# Patient Record
Sex: Female | Born: 1940 | Hispanic: Yes
Health system: Midwestern US, Community
[De-identification: ages and names within clinical notes are randomized; demographics above are authoritative.]

## PROBLEM LIST (undated history)

## (undated) DIAGNOSIS — F329 Major depressive disorder, single episode, unspecified: Secondary | ICD-10-CM

## (undated) DIAGNOSIS — E785 Hyperlipidemia, unspecified: Secondary | ICD-10-CM

## (undated) DIAGNOSIS — T7840XA Allergy, unspecified, initial encounter: Secondary | ICD-10-CM

## (undated) DIAGNOSIS — F419 Anxiety disorder, unspecified: Secondary | ICD-10-CM

## (undated) DIAGNOSIS — M8580 Other specified disorders of bone density and structure, unspecified site: Secondary | ICD-10-CM

## (undated) DIAGNOSIS — F32A Depression, unspecified: Secondary | ICD-10-CM

## (undated) DIAGNOSIS — D649 Anemia, unspecified: Secondary | ICD-10-CM

## (undated) DIAGNOSIS — E1136 Type 2 diabetes mellitus with diabetic cataract: Secondary | ICD-10-CM

## (undated) HISTORY — DX: Major depressive disorder, single episode, unspecified: F32.9

## (undated) HISTORY — DX: Hyperlipidemia, unspecified: E78.5

## (undated) HISTORY — DX: Other specified disorders of bone density and structure, unspecified site: M85.80

## (undated) HISTORY — DX: Anxiety disorder, unspecified: F41.9

## (undated) HISTORY — DX: Anemia, unspecified: D64.9

## (undated) HISTORY — DX: Depression, unspecified: F32.A

## (undated) HISTORY — DX: Allergy, unspecified, initial encounter: T78.40XA

---

## 1961-10-05 HISTORY — PX: APPENDECTOMY: SHX54

## 1998-05-01 ENCOUNTER — Encounter: Admission: RE | Admit: 1998-05-01 | Discharge: 1998-05-01 | Payer: Self-pay | Admitting: Internal Medicine

## 1998-09-19 ENCOUNTER — Encounter: Admission: RE | Admit: 1998-09-19 | Discharge: 1998-09-19 | Payer: Self-pay | Admitting: Internal Medicine

## 1999-08-07 ENCOUNTER — Encounter: Admission: RE | Admit: 1999-08-07 | Discharge: 1999-08-07 | Payer: Self-pay | Admitting: Internal Medicine

## 1999-11-25 ENCOUNTER — Encounter: Admission: RE | Admit: 1999-11-25 | Discharge: 1999-11-25 | Payer: Self-pay | Admitting: Internal Medicine

## 2004-08-11 ENCOUNTER — Ambulatory Visit: Payer: Self-pay | Admitting: Family Medicine

## 2004-10-07 ENCOUNTER — Ambulatory Visit: Payer: Self-pay | Admitting: Family Medicine

## 2004-10-30 ENCOUNTER — Ambulatory Visit: Payer: Self-pay | Admitting: Family Medicine

## 2005-02-11 ENCOUNTER — Ambulatory Visit: Payer: Self-pay | Admitting: Family Medicine

## 2005-07-09 ENCOUNTER — Ambulatory Visit: Payer: Self-pay | Admitting: Family Medicine

## 2005-07-16 ENCOUNTER — Ambulatory Visit: Payer: Self-pay | Admitting: Family Medicine

## 2005-07-23 ENCOUNTER — Ambulatory Visit: Payer: Self-pay | Admitting: Family Medicine

## 2005-08-10 ENCOUNTER — Ambulatory Visit: Payer: Self-pay | Admitting: Family Medicine

## 2005-12-03 ENCOUNTER — Encounter (INDEPENDENT_AMBULATORY_CARE_PROVIDER_SITE_OTHER): Payer: Self-pay | Admitting: Internal Medicine

## 2005-12-07 ENCOUNTER — Ambulatory Visit: Payer: Self-pay | Admitting: Family Medicine

## 2005-12-17 ENCOUNTER — Ambulatory Visit: Payer: Self-pay | Admitting: Family Medicine

## 2005-12-29 ENCOUNTER — Other Ambulatory Visit: Admission: RE | Admit: 2005-12-29 | Discharge: 2005-12-29 | Payer: Self-pay | Admitting: Family Medicine

## 2005-12-29 ENCOUNTER — Ambulatory Visit: Payer: Self-pay | Admitting: Family Medicine

## 2006-06-03 ENCOUNTER — Ambulatory Visit: Payer: Self-pay | Admitting: Family Medicine

## 2006-07-15 ENCOUNTER — Ambulatory Visit: Payer: Self-pay | Admitting: Family Medicine

## 2006-08-24 ENCOUNTER — Ambulatory Visit: Payer: Self-pay | Admitting: Family Medicine

## 2006-08-30 ENCOUNTER — Ambulatory Visit: Payer: Self-pay | Admitting: Family Medicine

## 2006-09-01 ENCOUNTER — Ambulatory Visit: Payer: Self-pay | Admitting: Family Medicine

## 2006-11-25 ENCOUNTER — Ambulatory Visit: Payer: Self-pay | Admitting: Family Medicine

## 2007-01-03 ENCOUNTER — Ambulatory Visit: Payer: Self-pay | Admitting: Family Medicine

## 2007-04-19 ENCOUNTER — Encounter: Payer: Self-pay | Admitting: Internal Medicine

## 2007-04-19 DIAGNOSIS — N841 Polyp of cervix uteri: Secondary | ICD-10-CM | POA: Insufficient documentation

## 2007-04-19 DIAGNOSIS — M949 Disorder of cartilage, unspecified: Secondary | ICD-10-CM

## 2007-04-19 DIAGNOSIS — G5600 Carpal tunnel syndrome, unspecified upper limb: Secondary | ICD-10-CM

## 2007-04-19 DIAGNOSIS — E785 Hyperlipidemia, unspecified: Secondary | ICD-10-CM

## 2007-04-19 DIAGNOSIS — M899 Disorder of bone, unspecified: Secondary | ICD-10-CM | POA: Insufficient documentation

## 2007-04-19 DIAGNOSIS — K5289 Other specified noninfective gastroenteritis and colitis: Secondary | ICD-10-CM

## 2007-04-21 ENCOUNTER — Ambulatory Visit: Payer: Self-pay | Admitting: Family Medicine

## 2007-04-21 DIAGNOSIS — R5381 Other malaise: Secondary | ICD-10-CM | POA: Insufficient documentation

## 2007-04-21 DIAGNOSIS — N959 Unspecified menopausal and perimenopausal disorder: Secondary | ICD-10-CM | POA: Insufficient documentation

## 2007-04-21 DIAGNOSIS — K219 Gastro-esophageal reflux disease without esophagitis: Secondary | ICD-10-CM

## 2007-04-21 DIAGNOSIS — R5383 Other fatigue: Secondary | ICD-10-CM

## 2007-04-22 LAB — CONVERTED CEMR LAB
ALT: 22 units/L (ref 0–35)
BUN: 8 mg/dL (ref 6–23)
Basophils Absolute: 0 10*3/uL (ref 0.0–0.1)
Basophils Relative: 0.5 % (ref 0.0–1.0)
Calcium: 9.4 mg/dL (ref 8.4–10.5)
Cholesterol: 160 mg/dL (ref 0–200)
Eosinophils Absolute: 0.1 10*3/uL (ref 0.0–0.6)
Eosinophils Relative: 2.5 % (ref 0.0–5.0)
GFR calc Af Amer: 129 mL/min
GFR calc non Af Amer: 107 mL/min
HCT: 38.3 % (ref 36.0–46.0)
HDL: 53.9 mg/dL (ref >39.0)
Lymphocytes Relative: 40.1 % (ref 12.0–46.0)
MCHC: 35 g/dL (ref 30.0–36.0)
MCV: 89.4 fL (ref 78.0–100.0)
Monocytes Relative: 8.3 % (ref 3.0–11.0)
Neutrophils Relative %: 48.6 % (ref 43.0–77.0)
Platelets: 248 10*3/uL (ref 150–400)
RBC: 4.28 M/uL (ref 3.87–5.11)
RDW: 12.6 % (ref 11.5–14.6)
Total CHOL/HDL Ratio: 3
Triglycerides: 108 mg/dL (ref 0–149)
WBC: 4.7 10*3/uL (ref 4.5–10.5)

## 2007-05-09 ENCOUNTER — Ambulatory Visit: Payer: Self-pay | Admitting: Family Medicine

## 2007-07-21 ENCOUNTER — Encounter: Payer: Self-pay | Admitting: Obstetrics & Gynecology

## 2007-07-21 ENCOUNTER — Encounter (INDEPENDENT_AMBULATORY_CARE_PROVIDER_SITE_OTHER): Payer: Self-pay | Admitting: Internal Medicine

## 2007-07-21 ENCOUNTER — Ambulatory Visit: Payer: Self-pay | Admitting: Obstetrics & Gynecology

## 2007-07-22 ENCOUNTER — Ambulatory Visit: Payer: Self-pay | Admitting: Family Medicine

## 2007-07-27 ENCOUNTER — Encounter: Admission: RE | Admit: 2007-07-27 | Discharge: 2007-07-27 | Payer: Self-pay | Admitting: Obstetrics & Gynecology

## 2007-07-29 ENCOUNTER — Ambulatory Visit: Payer: Self-pay | Admitting: Family Medicine

## 2007-07-29 ENCOUNTER — Encounter (INDEPENDENT_AMBULATORY_CARE_PROVIDER_SITE_OTHER): Payer: Self-pay | Admitting: Internal Medicine

## 2007-07-29 DIAGNOSIS — N942 Vaginismus: Secondary | ICD-10-CM

## 2007-07-29 DIAGNOSIS — J309 Allergic rhinitis, unspecified: Secondary | ICD-10-CM | POA: Insufficient documentation

## 2007-07-29 LAB — CONVERTED CEMR LAB
Specific Gravity, Urine: 1.01
pH: 7.5

## 2007-08-24 ENCOUNTER — Ambulatory Visit: Payer: Self-pay | Admitting: Family Medicine

## 2007-08-24 DIAGNOSIS — R609 Edema, unspecified: Secondary | ICD-10-CM

## 2007-08-25 ENCOUNTER — Telehealth (INDEPENDENT_AMBULATORY_CARE_PROVIDER_SITE_OTHER): Payer: Self-pay | Admitting: *Deleted

## 2007-08-26 ENCOUNTER — Ambulatory Visit: Payer: Self-pay | Admitting: Family Medicine

## 2007-08-29 ENCOUNTER — Encounter (INDEPENDENT_AMBULATORY_CARE_PROVIDER_SITE_OTHER): Payer: Self-pay | Admitting: Internal Medicine

## 2007-09-15 ENCOUNTER — Ambulatory Visit: Payer: Self-pay | Admitting: Family Medicine

## 2007-09-19 ENCOUNTER — Telehealth (INDEPENDENT_AMBULATORY_CARE_PROVIDER_SITE_OTHER): Payer: Self-pay | Admitting: Internal Medicine

## 2007-09-20 ENCOUNTER — Ambulatory Visit: Payer: Self-pay | Admitting: Family Medicine

## 2007-09-20 ENCOUNTER — Encounter (INDEPENDENT_AMBULATORY_CARE_PROVIDER_SITE_OTHER): Payer: Self-pay | Admitting: Internal Medicine

## 2007-09-20 DIAGNOSIS — Z888 Allergy status to other drugs, medicaments and biological substances status: Secondary | ICD-10-CM

## 2007-09-23 ENCOUNTER — Encounter (INDEPENDENT_AMBULATORY_CARE_PROVIDER_SITE_OTHER): Payer: Self-pay | Admitting: Internal Medicine

## 2007-10-20 ENCOUNTER — Encounter (INDEPENDENT_AMBULATORY_CARE_PROVIDER_SITE_OTHER): Payer: Self-pay | Admitting: *Deleted

## 2007-10-26 ENCOUNTER — Ambulatory Visit: Payer: Self-pay | Admitting: Family Medicine

## 2007-10-27 LAB — CONVERTED CEMR LAB
ALT: 21 units/L (ref 0–35)
AST: 22 units/L (ref 0–37)
HDL: 54.5 mg/dL (ref >39.0)
Triglycerides: 111 mg/dL (ref 0–149)
VLDL: 22 mg/dL (ref 0–40)

## 2007-12-02 ENCOUNTER — Telehealth (INDEPENDENT_AMBULATORY_CARE_PROVIDER_SITE_OTHER): Payer: Self-pay | Admitting: Internal Medicine

## 2007-12-06 ENCOUNTER — Telehealth (INDEPENDENT_AMBULATORY_CARE_PROVIDER_SITE_OTHER): Payer: Self-pay | Admitting: Internal Medicine

## 2008-02-02 ENCOUNTER — Ambulatory Visit: Payer: Self-pay | Admitting: Family Medicine

## 2008-05-02 ENCOUNTER — Encounter (INDEPENDENT_AMBULATORY_CARE_PROVIDER_SITE_OTHER): Payer: Self-pay | Admitting: *Deleted

## 2008-05-14 ENCOUNTER — Telehealth (INDEPENDENT_AMBULATORY_CARE_PROVIDER_SITE_OTHER): Payer: Self-pay | Admitting: Internal Medicine

## 2008-06-20 ENCOUNTER — Ambulatory Visit: Payer: Self-pay | Admitting: Family Medicine

## 2008-06-20 LAB — CONVERTED CEMR LAB
Albumin: 4.1 g/dL (ref 3.5–5.2)
Bilirubin, Direct: 0.1 mg/dL (ref 0.0–0.3)
Cholesterol: 189 mg/dL (ref 0–200)
HDL: 54.2 mg/dL (ref >39.0)
Total Bilirubin: 0.8 mg/dL (ref 0.3–1.2)
Total CHOL/HDL Ratio: 3.5
Triglycerides: 240 mg/dL (ref 0–149)

## 2008-06-27 ENCOUNTER — Ambulatory Visit: Payer: Self-pay | Admitting: Family Medicine

## 2008-06-27 LAB — CONVERTED CEMR LAB
Bilirubin Urine: NEGATIVE
Glucose, Urine, Semiquant: NEGATIVE
RBC / HPF: 0
Urobilinogen, UA: NEGATIVE
pH: 7

## 2008-07-13 ENCOUNTER — Encounter (INDEPENDENT_AMBULATORY_CARE_PROVIDER_SITE_OTHER): Payer: Self-pay | Admitting: Internal Medicine

## 2008-07-13 ENCOUNTER — Ambulatory Visit: Payer: Self-pay | Admitting: Internal Medicine

## 2008-07-18 ENCOUNTER — Encounter (INDEPENDENT_AMBULATORY_CARE_PROVIDER_SITE_OTHER): Payer: Self-pay | Admitting: Internal Medicine

## 2008-08-01 ENCOUNTER — Ambulatory Visit: Payer: Self-pay | Admitting: Family Medicine

## 2008-08-03 ENCOUNTER — Telehealth (INDEPENDENT_AMBULATORY_CARE_PROVIDER_SITE_OTHER): Payer: Self-pay | Admitting: Internal Medicine

## 2008-09-03 ENCOUNTER — Telehealth: Payer: Self-pay | Admitting: Family Medicine

## 2008-10-29 ENCOUNTER — Telehealth: Payer: Self-pay | Admitting: Family Medicine

## 2008-11-19 ENCOUNTER — Telehealth: Payer: Self-pay | Admitting: Family Medicine

## 2008-12-19 ENCOUNTER — Ambulatory Visit: Payer: Self-pay | Admitting: Family Medicine

## 2008-12-19 ENCOUNTER — Telehealth (INDEPENDENT_AMBULATORY_CARE_PROVIDER_SITE_OTHER): Payer: Self-pay | Admitting: Internal Medicine

## 2008-12-19 DIAGNOSIS — R229 Localized swelling, mass and lump, unspecified: Secondary | ICD-10-CM

## 2008-12-20 ENCOUNTER — Telehealth (INDEPENDENT_AMBULATORY_CARE_PROVIDER_SITE_OTHER): Payer: Self-pay | Admitting: Internal Medicine

## 2008-12-27 ENCOUNTER — Telehealth (INDEPENDENT_AMBULATORY_CARE_PROVIDER_SITE_OTHER): Payer: Self-pay | Admitting: Internal Medicine

## 2009-01-02 ENCOUNTER — Ambulatory Visit: Payer: Self-pay | Admitting: Family Medicine

## 2009-01-08 LAB — CONVERTED CEMR LAB
Cholesterol: 201 mg/dL — ABNORMAL HIGH (ref 0–200)
Direct LDL: 95.3 mg/dL
Triglycerides: 228 mg/dL — ABNORMAL HIGH (ref 0.0–149.0)
VLDL: 45.6 mg/dL — ABNORMAL HIGH (ref 0.0–40.0)

## 2009-01-09 ENCOUNTER — Encounter (INDEPENDENT_AMBULATORY_CARE_PROVIDER_SITE_OTHER): Payer: Self-pay | Admitting: Internal Medicine

## 2009-03-15 ENCOUNTER — Encounter (INDEPENDENT_AMBULATORY_CARE_PROVIDER_SITE_OTHER): Payer: Self-pay | Admitting: Internal Medicine

## 2009-03-27 ENCOUNTER — Encounter (INDEPENDENT_AMBULATORY_CARE_PROVIDER_SITE_OTHER): Payer: Self-pay | Admitting: Internal Medicine

## 2009-04-30 ENCOUNTER — Ambulatory Visit: Payer: Self-pay | Admitting: Family Medicine

## 2009-04-30 DIAGNOSIS — M62838 Other muscle spasm: Secondary | ICD-10-CM | POA: Insufficient documentation

## 2009-05-03 ENCOUNTER — Telehealth: Payer: Self-pay | Admitting: Family Medicine

## 2009-05-06 ENCOUNTER — Telehealth: Payer: Self-pay | Admitting: Family Medicine

## 2009-05-08 ENCOUNTER — Encounter: Payer: Self-pay | Admitting: Family Medicine

## 2009-05-09 ENCOUNTER — Ambulatory Visit: Payer: Self-pay | Admitting: Family Medicine

## 2009-05-17 ENCOUNTER — Encounter: Payer: Self-pay | Admitting: Family Medicine

## 2009-06-05 ENCOUNTER — Inpatient Hospital Stay: Payer: Self-pay | Admitting: Internal Medicine

## 2009-06-05 ENCOUNTER — Ambulatory Visit: Payer: Self-pay | Admitting: Internal Medicine

## 2009-06-05 DIAGNOSIS — R079 Chest pain, unspecified: Secondary | ICD-10-CM | POA: Insufficient documentation

## 2009-06-07 ENCOUNTER — Ambulatory Visit: Payer: Self-pay | Admitting: Family Medicine

## 2009-06-11 LAB — CONVERTED CEMR LAB
CO2: 33 meq/L — ABNORMAL HIGH (ref 19–32)
Calcium: 9.5 mg/dL (ref 8.4–10.5)
Chloride: 98 meq/L (ref 96–112)
Creatinine, Ser: 0.6 mg/dL (ref 0.4–1.2)
Eosinophils Absolute: 0.1 10*3/uL (ref 0.0–0.7)
Glucose, Bld: 94 mg/dL (ref 70–99)
Lymphs Abs: 1.8 10*3/uL (ref 0.7–4.0)
MCHC: 33.8 g/dL (ref 30.0–36.0)
MCV: 94.7 fL (ref 78.0–100.0)
Neutrophils Relative %: 56.1 % (ref 43.0–77.0)
Potassium: 4.3 meq/L (ref 3.5–5.1)
RBC: 4.32 M/uL (ref 3.87–5.11)
RDW: 13.2 % (ref 11.5–14.6)
Sodium: 137 meq/L (ref 135–145)
TSH: 1.62 microintl units/mL (ref 0.35–5.50)

## 2009-06-12 ENCOUNTER — Ambulatory Visit: Payer: Self-pay | Admitting: Family Medicine

## 2009-06-13 ENCOUNTER — Telehealth (INDEPENDENT_AMBULATORY_CARE_PROVIDER_SITE_OTHER): Payer: Self-pay | Admitting: Internal Medicine

## 2009-06-14 ENCOUNTER — Encounter (INDEPENDENT_AMBULATORY_CARE_PROVIDER_SITE_OTHER): Payer: Self-pay | Admitting: Internal Medicine

## 2009-06-14 ENCOUNTER — Telehealth (INDEPENDENT_AMBULATORY_CARE_PROVIDER_SITE_OTHER): Payer: Self-pay | Admitting: Internal Medicine

## 2009-06-17 ENCOUNTER — Encounter: Payer: Self-pay | Admitting: Family Medicine

## 2009-07-10 ENCOUNTER — Telehealth (INDEPENDENT_AMBULATORY_CARE_PROVIDER_SITE_OTHER): Payer: Self-pay | Admitting: Internal Medicine

## 2009-07-10 ENCOUNTER — Ambulatory Visit: Payer: Self-pay | Admitting: Family Medicine

## 2009-07-11 LAB — CONVERTED CEMR LAB
AST: 22 units/L (ref 0–37)
Albumin: 3.9 g/dL (ref 3.5–5.2)
LDL Cholesterol: 71 mg/dL (ref 0–99)
Total Protein: 6.8 g/dL (ref 6.0–8.3)
Triglycerides: 82 mg/dL (ref 0.0–149.0)
VLDL: 16.4 mg/dL (ref 0.0–40.0)

## 2009-08-20 ENCOUNTER — Ambulatory Visit: Payer: Self-pay | Admitting: Family Medicine

## 2009-09-06 ENCOUNTER — Telehealth (INDEPENDENT_AMBULATORY_CARE_PROVIDER_SITE_OTHER): Payer: Self-pay | Admitting: Internal Medicine

## 2009-10-31 ENCOUNTER — Telehealth: Payer: Self-pay | Admitting: Family Medicine

## 2009-11-18 ENCOUNTER — Ambulatory Visit: Payer: Self-pay | Admitting: Family Medicine

## 2009-11-28 ENCOUNTER — Telehealth: Payer: Self-pay | Admitting: Family Medicine

## 2009-12-24 ENCOUNTER — Telehealth: Payer: Self-pay | Admitting: Family Medicine

## 2009-12-30 ENCOUNTER — Telehealth: Payer: Self-pay | Admitting: Family Medicine

## 2010-01-03 ENCOUNTER — Telehealth: Payer: Self-pay | Admitting: Family Medicine

## 2010-01-06 ENCOUNTER — Ambulatory Visit: Payer: Self-pay | Admitting: Family Medicine

## 2010-01-09 LAB — CONVERTED CEMR LAB
ALT: 24 units/L (ref 0–35)
AST: 22 units/L (ref 0–37)
Cholesterol: 176 mg/dL (ref 0–200)
LDL Cholesterol: 75 mg/dL (ref 0–99)
Triglycerides: 133 mg/dL (ref 0.0–149.0)
VLDL: 26.6 mg/dL (ref 0.0–40.0)

## 2010-01-10 ENCOUNTER — Telehealth: Payer: Self-pay | Admitting: Family Medicine

## 2010-01-13 ENCOUNTER — Ambulatory Visit: Payer: Self-pay | Admitting: Family Medicine

## 2010-01-13 DIAGNOSIS — H6120 Impacted cerumen, unspecified ear: Secondary | ICD-10-CM

## 2010-01-20 ENCOUNTER — Ambulatory Visit: Payer: Self-pay | Admitting: Family Medicine

## 2010-02-03 ENCOUNTER — Ambulatory Visit: Payer: Self-pay | Admitting: Family Medicine

## 2010-02-17 ENCOUNTER — Telehealth: Payer: Self-pay | Admitting: Family Medicine

## 2010-02-24 ENCOUNTER — Encounter: Payer: Self-pay | Admitting: Family Medicine

## 2010-02-24 ENCOUNTER — Encounter: Admission: RE | Admit: 2010-02-24 | Discharge: 2010-02-24 | Payer: Self-pay | Admitting: Family Medicine

## 2010-02-24 ENCOUNTER — Ambulatory Visit: Payer: Self-pay | Admitting: Internal Medicine

## 2010-03-14 ENCOUNTER — Encounter: Payer: Self-pay | Admitting: Family Medicine

## 2010-03-19 ENCOUNTER — Telehealth: Payer: Self-pay | Admitting: Family Medicine

## 2010-04-15 ENCOUNTER — Telehealth: Payer: Self-pay | Admitting: Family Medicine

## 2010-05-14 ENCOUNTER — Telehealth: Payer: Self-pay | Admitting: Family Medicine

## 2010-06-10 ENCOUNTER — Telehealth: Payer: Self-pay | Admitting: Family Medicine

## 2010-06-24 ENCOUNTER — Ambulatory Visit: Payer: Self-pay | Admitting: Family Medicine

## 2010-07-08 ENCOUNTER — Telehealth: Payer: Self-pay | Admitting: Family Medicine

## 2010-07-11 ENCOUNTER — Telehealth: Payer: Self-pay | Admitting: Family Medicine

## 2010-08-01 ENCOUNTER — Telehealth: Payer: Self-pay | Admitting: Family Medicine

## 2010-09-01 ENCOUNTER — Telehealth: Payer: Self-pay | Admitting: Family Medicine

## 2010-09-22 ENCOUNTER — Ambulatory Visit: Payer: Self-pay | Admitting: Family Medicine

## 2010-09-22 ENCOUNTER — Ambulatory Visit: Payer: Self-pay | Admitting: Internal Medicine

## 2010-09-23 ENCOUNTER — Telehealth: Payer: Self-pay | Admitting: Family Medicine

## 2010-09-23 LAB — CONVERTED CEMR LAB
Bilirubin, Direct: 0.1 mg/dL (ref 0.0–0.3)
Cholesterol: 235 mg/dL — ABNORMAL HIGH (ref 0–200)
Direct LDL: 137.2 mg/dL
Total Bilirubin: 0.9 mg/dL (ref 0.3–1.2)
Triglycerides: 135 mg/dL (ref 0.0–149.0)
VLDL: 27 mg/dL (ref 0.0–40.0)

## 2010-09-25 ENCOUNTER — Telehealth: Payer: Self-pay | Admitting: Family Medicine

## 2010-09-25 ENCOUNTER — Encounter: Payer: Self-pay | Admitting: Family Medicine

## 2010-09-26 ENCOUNTER — Telehealth: Payer: Self-pay | Admitting: Family Medicine

## 2010-09-30 ENCOUNTER — Telehealth: Payer: Self-pay | Admitting: Family Medicine

## 2010-10-01 ENCOUNTER — Telehealth: Payer: Self-pay | Admitting: Family Medicine

## 2010-10-15 ENCOUNTER — Ambulatory Visit
Admission: RE | Admit: 2010-10-15 | Discharge: 2010-10-15 | Payer: Self-pay | Source: Home / Self Care | Attending: Family Medicine | Admitting: Family Medicine

## 2010-10-15 DIAGNOSIS — M7120 Synovial cyst of popliteal space [Baker], unspecified knee: Secondary | ICD-10-CM | POA: Insufficient documentation

## 2010-10-15 DIAGNOSIS — M2390 Unspecified internal derangement of unspecified knee: Secondary | ICD-10-CM | POA: Insufficient documentation

## 2010-10-20 IMAGING — CR DG CHEST 1V PORT
1 series · 1 of 1 positions shown · non-contrast
Comparison: none

REASON FOR EXAM: midsternal chest pressure/burning
COMMENTS:

PROCEDURE:     DXR - DXR PORTABLE CHEST SINGLE VIEW  - June 05, 2009 [DATE]
RESULT:     Comparison: None

[view not recorded]
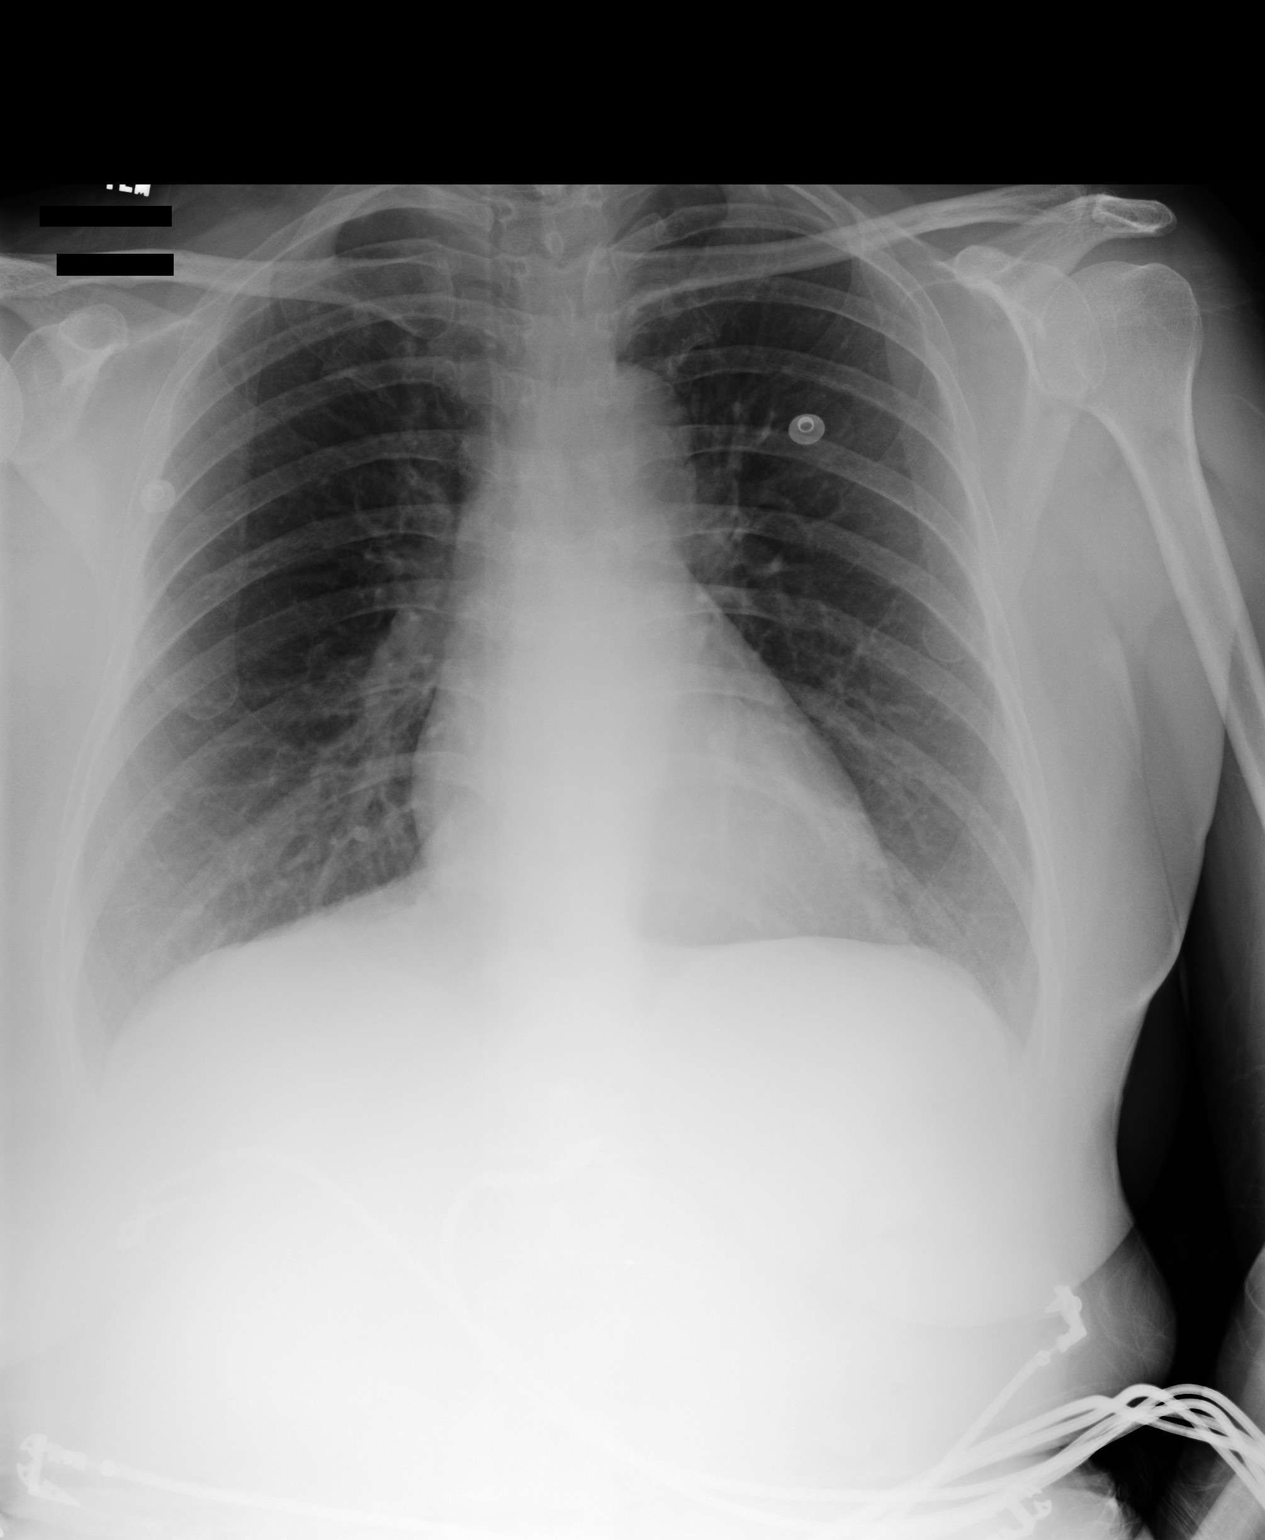

[1 of 1 positions shown; findings below may reference images not displayed]

FINDINGS: Single portable AP chest radiograph is provided. There is no focal
parenchymal opacity, pleural effusion, or pneumothorax. Normal
cardiomediastinal silhouette. The osseous structures are unremarkable.
IMPRESSION: No acute disease of the chest.

## 2010-10-20 IMAGING — CT CT HEAD WITHOUT CONTRAST
1 series · 16 of 29 positions shown, 20 images · non-contrast
Comparison: none

REASON FOR EXAM: presyncope
COMMENTS:

PROCEDURE:     CT  - CT HEAD WITHOUT CONTRAST  - June 05, 2009  [DATE]
RESULT:     Comparison:  None
TECHNIQUE: Multiple axial images from the foramen magnum to the vertex were
obtained without IV contrast.

[Series 2: soft tissue · axial · 0.39mm/px · z∈[+572,+702]mm · 16 of 29 slices shown, 20 images]
[im 2/29  brain]
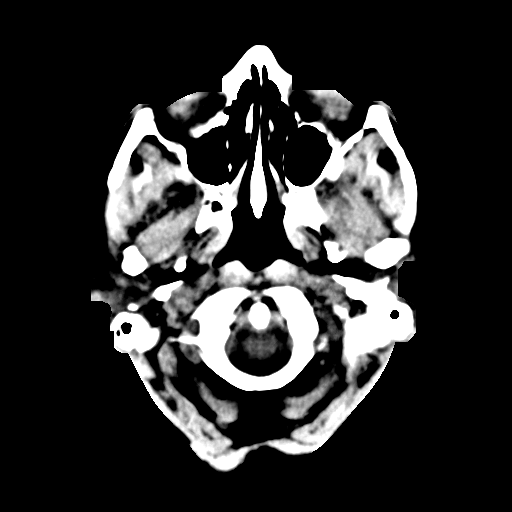
[im 2/29  bone]
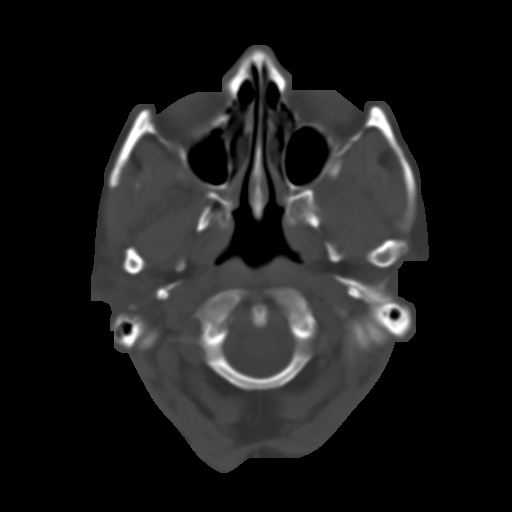
[im 4/29  brain]
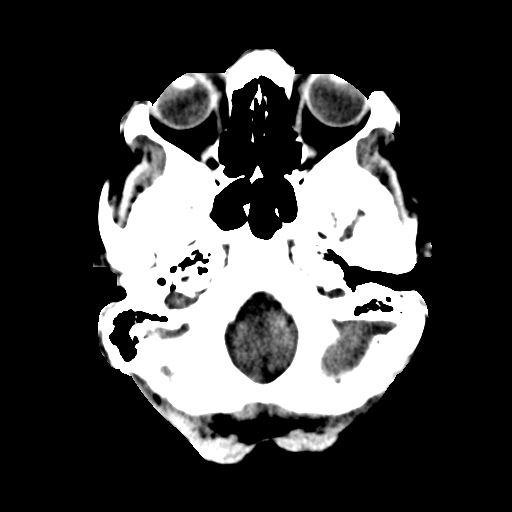
[im 6/29  brain]
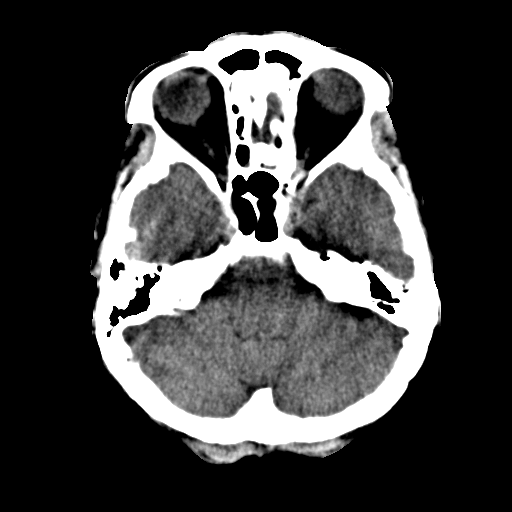
[im 7/29  brain]
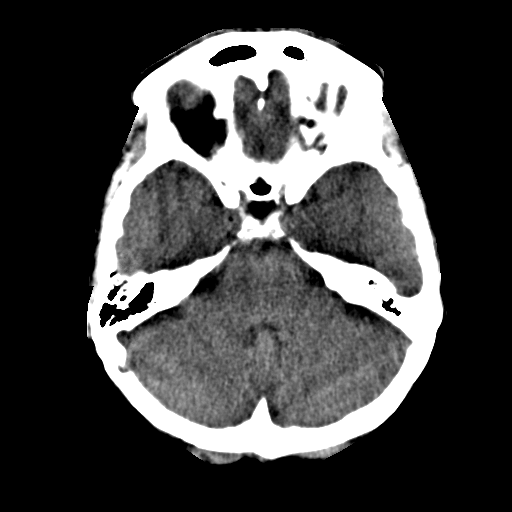
[im 9/29  brain]
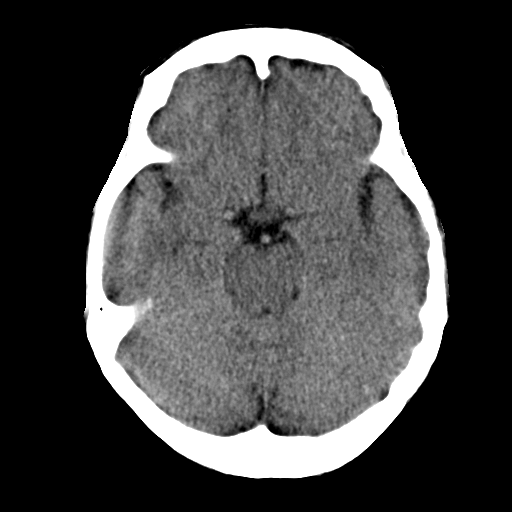
[im 9/29  bone]
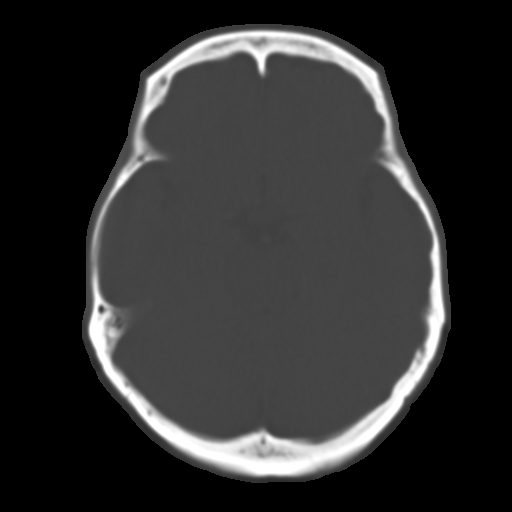
[im 11/29  brain]
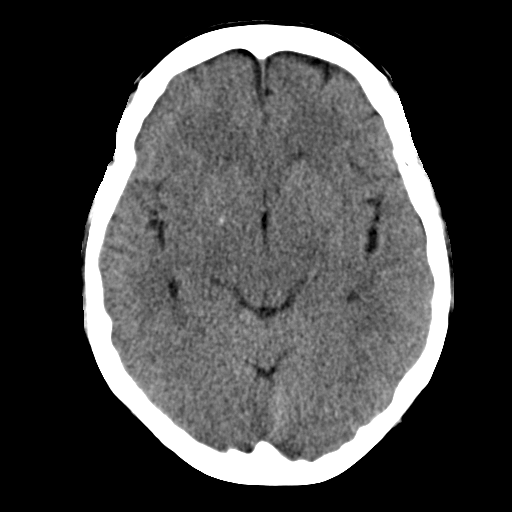
[im 12/29  brain]
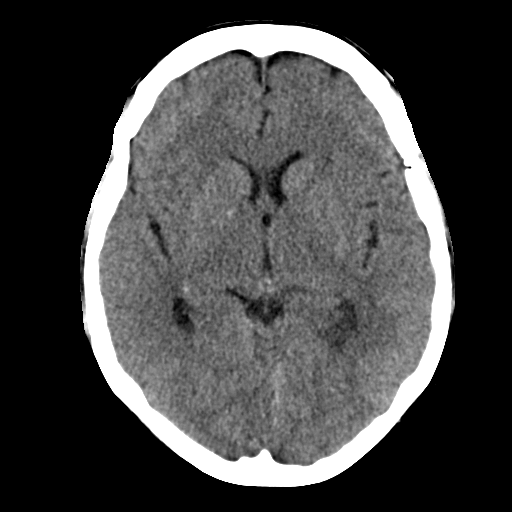
[im 14/29  brain]
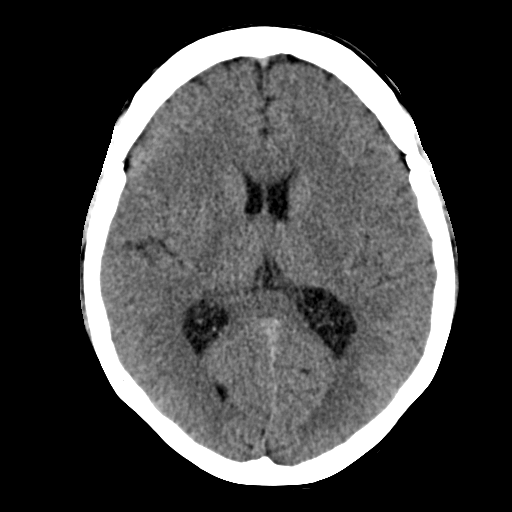
[im 16/29  brain]
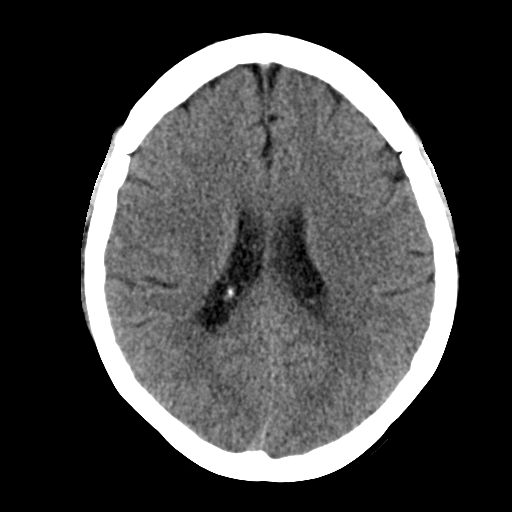
[im 16/29  bone]
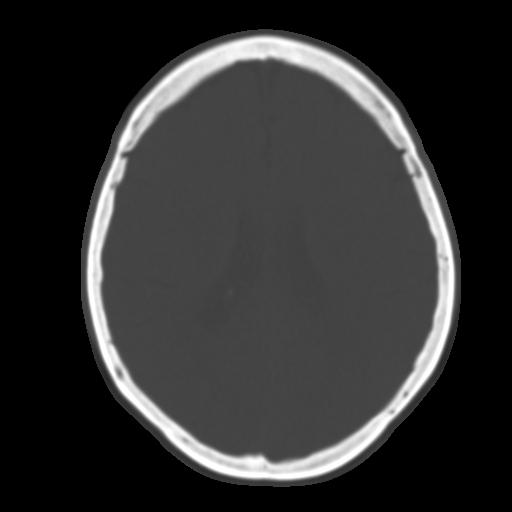
[im 18/29  brain]
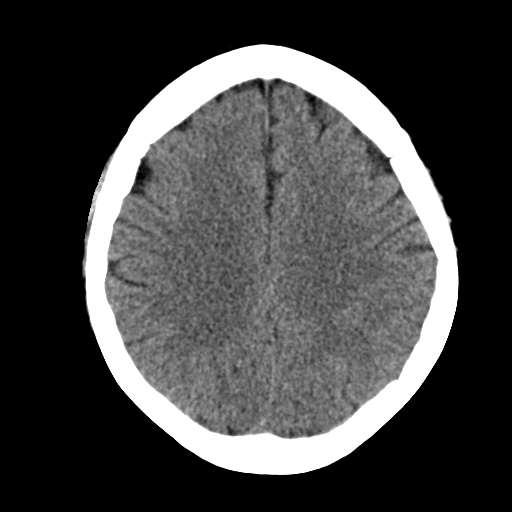
[im 19/29  brain]
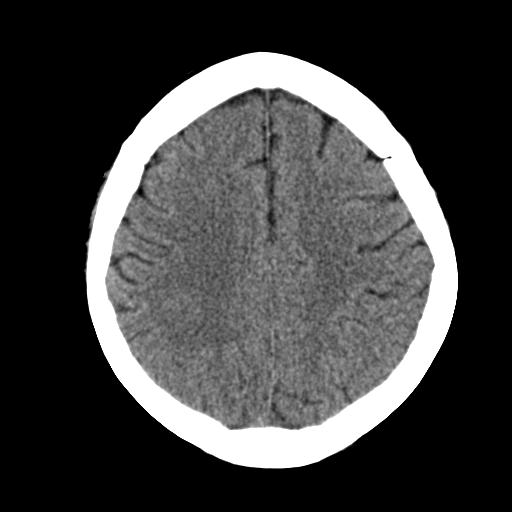
[im 21/29  brain]
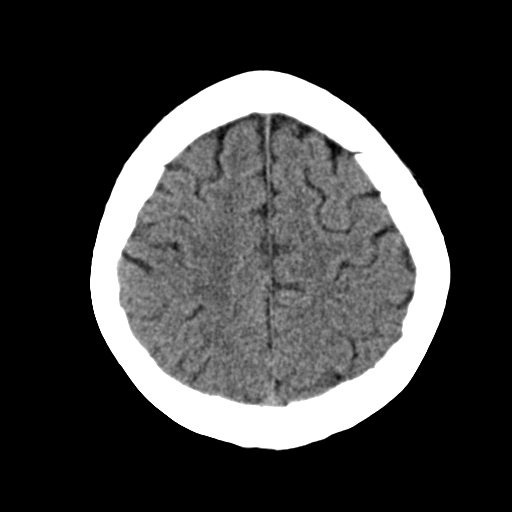
[im 23/29  brain]
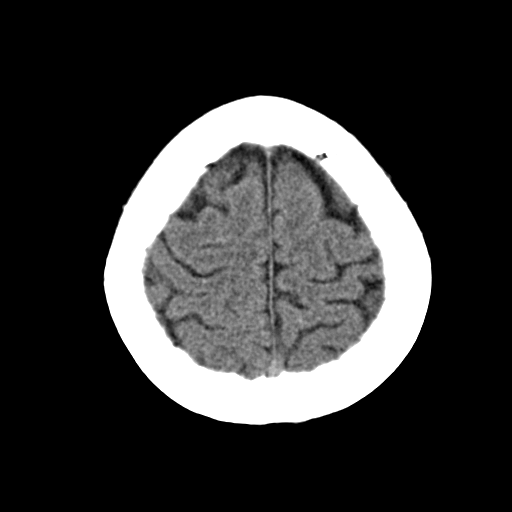
[im 23/29  bone]
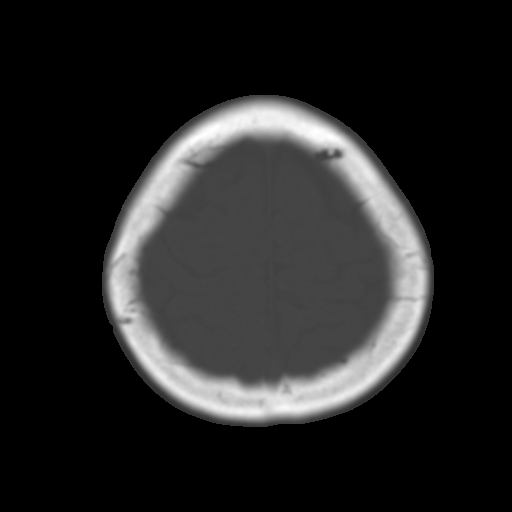
[im 24/29  brain]
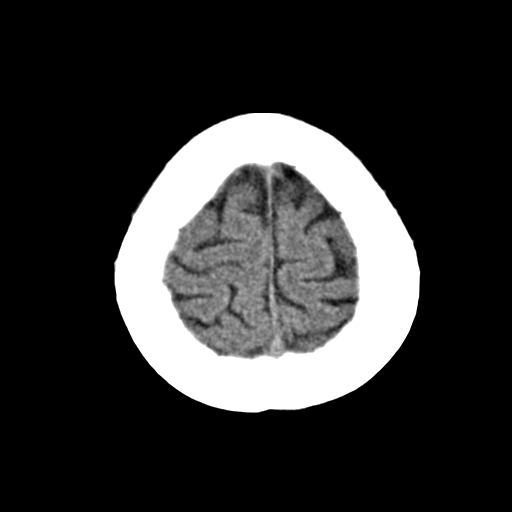
[im 26/29  brain]
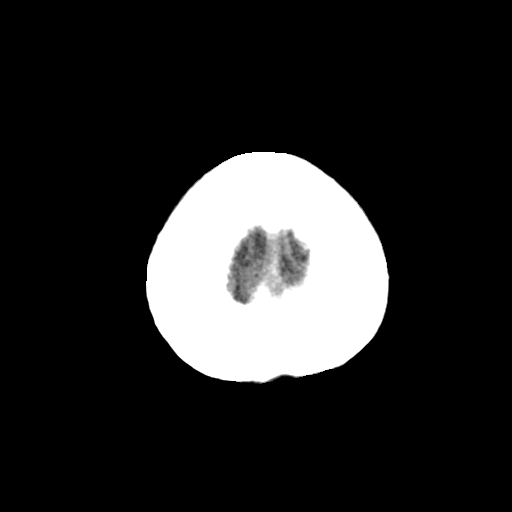
[im 28/29  brain]
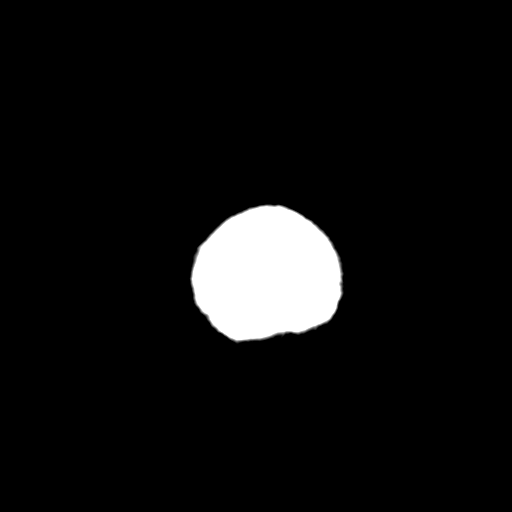

[16 of 29 positions shown; findings below may reference images not displayed]

FINDINGS: There is no evidence of mass effect, midline shift, or extra-axial fluid
collections.  There is no evidence of a space-occupying lesion or
intracranial hemorrhage. There is no evidence of a cortical-based area of
acute infarction.

The ventricles and sulci are appropriate for the patient's age. The basal
cisterns are patent.

Visualized portions of the orbits are unremarkable. The visualized portions
of the paranasal sinuses and mastoid air cells are unremarkable.

The osseous structures are unremarkable.
IMPRESSION: No acute intracranial process.

## 2010-10-28 ENCOUNTER — Telehealth: Payer: Self-pay | Admitting: Family Medicine

## 2010-11-04 NOTE — Assessment & Plan Note (Signed)
Summary: 30 MIN L SIDE PAIN/BILLIE'S PT/CLE   Vital Signs:  Patient profile:   70 year old female Height:      57 inches Weight:      110.50 pounds BMI:     24.00 Temp:     98.6 degrees F oral Pulse rate:   84 / minute Pulse rhythm:   regular BP sitting:   128 / 74  (left arm) Cuff size:   large  Vitals Entered By: Delilah Shan CMA Duncan Dull) (November 18, 2009 2:08 PM) CC: 30 minute - Left side pain   History of Present Illness: 70 yo with 1 week of left sided lower back pain. Works as Conservation officer, nature for [employer].  Was working, turned to left and bent down to get something, since then has this intermittent left sided pain.  8/10 at its worst. Alprazolam which she takes for Anxiety, sometimes helps. No tingling into left lower leg, no LE weakness, no saddle anethesia or urinary complaints. Has had something like this before which happened at work as well. No n/v/d.  Current Medications (verified): 1)  Zoloft 50 Mg Tabs (Sertraline Hcl) .Marland Kitchen.. 1 By Mouth Daily 2)  Lipitor 20 Mg Tabs (Atorvastatin Calcium) .Marland Kitchen.. 1 By Mouth Once Daily 3)  Multivitamins   Tabs (Multiple Vitamin) .... Once Daily 4)  Prilosec 10 Mg  Cpdr (Omeprazole) .... Two Daily 5)  Calcium .... Take One By Mouth Two Times A Day 6)  Ambien 10 Mg Tabs (Zolpidem Tartrate) .... One Tab By Mouth At Bedtime As Needed For Difficulty Sleeping...to Be Used Sparingly. 7)  Alprazolam 0.25 Mg Tabs (Alprazolam) .... 1/2 To 1 Once Daily For Anxiety As Needed 8)  Aspirin 81 Mg  Tabs (Aspirin) .... One Daily 9)  Cyclobenzaprine Hcl 10 Mg  Tabs (Cyclobenzaprine Hcl) .Marland Kitchen.. 1 By Mouth 2 Times Daily As Needed For Back Pain  Allergies: 1)  ! Latex Gloves (Disposable Gloves) 2)  ! Augmentin  Review of Systems      See HPI General:  Denies chills and fever. GU:  Denies dysuria, hematuria, incontinence, urinary frequency, and urinary hesitancy. MS:  Complains of low back pain; denies loss of strength.  Physical Exam  General:  alert,  well-developed, well-nourished, and well-hydrated.   Abdomen:  soft, non-tender, normal bowel sounds, no distention, no masses, no guarding, no abdominal hernia, and no inguinal hernia.     Detailed Back/Spine Exam  Lumbosacral Exam:  Inspection-deformity:    Normal    FROM without pain. tight paraspinous muscles. Palpation-spinal tenderness:  Normal Lying Straight Leg Raise:    Right:  negative    Left:  negative Fabere Test:    Right:  negative    Left:  negative   Impression & Recommendations:  Problem # 1:  LUMBOSACRAL STRAIN, ACUTE (ICD-846.0) Assessment New Likely lumbar strain, no red flag symptoms. Try muscle relaxants as needed, especially at night. See pt instructions for details.  Complete Medication List: 1)  Zoloft 50 Mg Tabs (Sertraline hcl) .Marland Kitchen.. 1 by mouth daily 2)  Lipitor 20 Mg Tabs (Atorvastatin calcium) .Marland Kitchen.. 1 by mouth once daily 3)  Multivitamins Tabs (Multiple vitamin) .... Once daily 4)  Prilosec 10 Mg Cpdr (Omeprazole) .... Two daily 5)  Calcium  .... Take one by mouth two times a day 6)  Ambien 10 Mg Tabs (Zolpidem tartrate) .... One tab by mouth at bedtime as needed for difficulty sleeping...to be used sparingly. 7)  Alprazolam 0.25 Mg Tabs (Alprazolam) .... 1/2 to 1 once daily  for anxiety as needed 8)  Aspirin 81 Mg Tabs (Aspirin) .... One daily 9)  Cyclobenzaprine Hcl 10 Mg Tabs (Cyclobenzaprine hcl) .Marland Kitchen.. 1 by mouth 2 times daily as needed for back pain  Patient Instructions: 1)  Most patients (90%) with low back pain will improve with time ( 2-6 weeks). Keep active but avoid activities that are painful. Apply moist heat and/or ice to lower back several times a day.  Prescriptions: CYCLOBENZAPRINE HCL 10 MG  TABS (CYCLOBENZAPRINE HCL) 1 by mouth 2 times daily as needed for back pain  #20 x 0   Entered and Authorized by:   Ruthe Mannan MD   Signed by:   Ruthe Mannan MD on 11/18/2009   Method used:   Electronically to        [facility] [address]  (retail)       [address], [address]       [place], [place]  [address]       Ph: [phone]       Fax: [phone]   RxID:   [id number]   Current Allergies (reviewed today): ! LATEX GLOVES (DISPOSABLE GLOVES) ! AUGMENTIN

## 2010-11-04 NOTE — Assessment & Plan Note (Signed)
Summary: cpx per dr Dayton Martes   Vital Signs:  Patient profile:   70 year old female Height:      57 inches Weight:      113 pounds BMI:     24.54 Temp:     98.2 degrees F oral Pulse rate:   80 / minute Pulse rhythm:   regular BP sitting:   100 / 68  (left arm) Cuff size:   regular  Vitals Entered By: Lewanda Rife LPN (January 20, 2010 11:48 AM) CC: CPX pt wants breast exam but goes to GYN for pap smear   History of Present Illness: 11 you here for CPX.   HLD- well controlled on Lipitor 20 mg daily. 01/06/10- LDL 75, HDL 74, TG 133. LFTs within normal limits.  No myalgias.  Anxiety and depression- currently controlled with Zoloft 50 mg daily and as needed Alprazolam. Says she does better now because she is working full time, less time for her to get anxious about things. No SI or HI.  Insomnia- could not tolerate bad taste from Lunesta, would like to go back to Ambien.  Well woman- we placed referral for DEXA and mammogram appt to be scheduled last time she was here but she needs to reschedule due to transportation issues.   Dr. Penne Lash is OBGYN- has not had pap in over 3 years.  Not having any vaginal or urinary complaints. Not intersted in colonscopy at this time, wants to wait until next year. We do not have Zostavax in stock.    Current Medications (verified): 1)  Zoloft 50 Mg Tabs (Sertraline Hcl) .Marland Kitchen.. 1 By Mouth Daily 2)  Lipitor 20 Mg Tabs (Atorvastatin Calcium) .Marland Kitchen.. 1 By Mouth Once Daily 3)  Multivitamins   Tabs (Multiple Vitamin) .... Once Daily 4)  Prilosec 10 Mg  Cpdr (Omeprazole) .... Two Daily 5)  Calcium .... Take One By Mouth Two Times A Day 6)  Alprazolam 0.25 Mg Tabs (Alprazolam) .... 1/2 To 1 Once Daily For Anxiety As Needed 7)  Aspirin 81 Mg  Tabs (Aspirin) .... One Daily 8)  Nasonex 50 Mcg/act Susp (Mometasone Furoate) .Marland Kitchen.. 1-2 Each Nostril Qd 9)  Vitamin B-12 500 Mcg  Tabs (Cyanocobalamin) .... Take 1 Tablet By Mouth Once A Day 10)  Vitamin D 1000 Unit   Tabs (Cholecalciferol) .... Take 1 Tablet By Mouth Once A Day 11)  Fish Oil   Oil (Fish Oil) .... Takes Two Daily 12)  Ambien 10 Mg Tabs (Zolpidem Tartrate) .Marland Kitchen.. 1 Tab By Mouth At Bedtime As Needed Insomnia  Allergies: 1)  ! Latex Gloves (Disposable Gloves) 2)  ! Augmentin  Review of Systems      See HPI General:  Denies chills, fatigue, and fever. Eyes:  Denies blurring. ENT:  Denies decreased hearing and difficulty swallowing. CV:  Denies chest pain or discomfort. Resp:  Denies shortness of breath. GI:  Denies abdominal pain and bloody stools. Psych:  Denies anxiety and depression.  Physical Exam  General:  GEN: Well-developed,well-nourished,in no acute distress; alert,appropriate and cooperative throughout examination  Head:  Normocephalic and atraumatic without obvious abnormalities. No apparent alopecia or balding. Eyes:  vision grossly intact, pupils equal, and pupils round.   Ears:  External ear exam shows no significant lesions or deformities.  Otoscopic examination reveals clear canals, tympanic membranes are intact bilaterally without bulging, retraction, inflammation or discharge. Hearing is grossly normal bilaterally. Nose:  External nasal examination shows no deformity or inflammation. Nasal mucosa are pink and moist without lesions  or exudates. Mouth:  MMM Breasts:  No mass, nodules, thickening, tenderness, bulging, retraction, inflamation, nipple discharge or skin changes noted.   Lungs:  Normal respiratory effort, chest expands symmetrically. Lungs are clear to auscultation, no crackles or wheezes. Heart:  Normal rate and regular rhythm. S1 and S2 normal without gallop, murmur, click, rub or other extra sounds. Abdomen:  Bowel sounds positive,abdomen soft and non-tender without masses, organomegaly or hernias noted. Extremities:  no edema Neurologic:  alert & oriented X3, cranial nerves II-XII intact, and strength normal in all extremities.   Skin:  Intact without  suspicious lesions or rashes Psych:  Cognition and judgment appear intact. Alert and cooperative with normal attention span and concentration. No apparent delusions, illusions, hallucinations   Impression & Recommendations:  Problem # 1:  WELL ADULT (ICD-V70.0) Reviewed preventive care protocols, scheduled due services, and updated immunizations Discussed nutrition, exercise, diet, and healthy lifestyle.  UTD on FLP, BMET. Setting up mammogram and DEXA scan. Awaiting Zostavax, she is considering colonoscopy.  Complete Medication List: 1)  Zoloft 50 Mg Tabs (Sertraline hcl) .Marland Kitchen.. 1 by mouth daily 2)  Lipitor 20 Mg Tabs (Atorvastatin calcium) .Marland Kitchen.. 1 by mouth once daily 3)  Multivitamins Tabs (Multiple vitamin) .... Once daily 4)  Prilosec 10 Mg Cpdr (Omeprazole) .... Two daily 5)  Calcium  .... Take one by mouth two times a day 6)  Alprazolam 0.25 Mg Tabs (Alprazolam) .... 1/2 to 1 once daily for anxiety as needed 7)  Aspirin 81 Mg Tabs (Aspirin) .... One daily 8)  Nasonex 50 Mcg/act Susp (Mometasone furoate) .Marland Kitchen.. 1-2 each nostril qd 9)  Vitamin B-12 500 Mcg Tabs (Cyanocobalamin) .... Take 1 tablet by mouth once a day 10)  Vitamin D 1000 Unit Tabs (Cholecalciferol) .... Take 1 tablet by mouth once a day 11)  Fish Oil Oil (Fish oil) .... Takes two daily 12)  Ambien 10 Mg Tabs (Zolpidem tartrate) .Marland Kitchen.. 1 tab by mouth at bedtime as needed insomnia  Patient Instructions: 1)  Good to see you, Ms. Fran Lowes. 2)  Please stop by to discuss your mammogram and bone density referral with marion. 3)  Let me know if you cannot get an appointment with your OBGYN. Prescriptions: AMBIEN 10 MG TABS (ZOLPIDEM TARTRATE) 1 tab by mouth at bedtime as needed insomnia  #30 x 0   Entered and Authorized by:   Ruthe Mannan MD   Signed by:   Ruthe Mannan MD on 01/20/2010   Method used:   Print then Mail to Patient   RxID:   [id number]   Current Allergies (reviewed today): ! LATEX GLOVES (DISPOSABLE  GLOVES) ! AUGMENTIN

## 2010-11-04 NOTE — Progress Notes (Signed)
Summary: Megan Hobbs  Phone Note Refill Request Message from:  Fax from Pharmacy on June 10, 2010 3:28 PM  Refills Requested: Medication #1:  AMBIEN 10 MG TABS 1 tab by mouth at bedtime as needed insomnia   Last Refilled: 05/14/2010 Fax request from [facility] [address]   Initial call taken by: Melody Comas,  June 10, 2010 3:28 PM  Follow-up for Phone Call        please call to pharmacy.  Follow-up by: Crawford Givens MD,  June 10, 2010 6:13 PM  Additional Follow-up for Phone Call Additional follow up Details #1::        Medication phoned to pharmacy.  Additional Follow-up by: Delilah Shan CMA (AAMA),  June 11, 2010 9:07 AM    Prescriptions: AMBIEN 10 MG TABS (ZOLPIDEM TARTRATE) 1 tab by mouth at bedtime as needed insomnia  #30 x 0   Entered and Authorized by:   Crawford Givens MD   Signed by:   Crawford Givens MD on 06/10/2010   Method used:   Telephoned to ...       [facility] [address] (retail)       [address], [address]       [place], [place]  [address]       Ph: [phone]       Fax: [phone]   RxID:   [id number]

## 2010-11-04 NOTE — Assessment & Plan Note (Signed)
Summary: Establish care   Vital Signs:  Patient profile:   70 year old female Height:      57 inches Weight:      111.50 pounds BMI:     24.22 Temp:     98.3 degrees F oral Pulse rate:   84 / minute Pulse rhythm:   regular BP sitting:   122 / 72  (left arm) Cuff size:   regular  Vitals Entered By: Delilah Shan CMA Duncan Dull) (January 13, 2010 10:53 AM) CC: establish care   History of Present Illness: 77 you here to establish care with me.  Bilateral ear itching- recently had URI treated with Zpack. Had a lot of fluid in her ears, that has improved but now ears are very itchy. No problems hearing.    HLD- well controlled on Lipitor 20 mg daily. 01/06/10- LDL 75, HDL 74, TG 133. LFTs within normal limits.  No myalgias.  Anxiety and depression- currently controlled with Zoloft 50 mg daily and as needed Alprazolam. Says she does better now because she is working full time, less time for her to get anxious about things. No SI or HI.     Current Medications (verified): 1)  Zoloft 50 Mg Tabs (Sertraline Hcl) .Marland Kitchen.. 1 By Mouth Daily 2)  Lipitor 20 Mg Tabs (Atorvastatin Calcium) .Marland Kitchen.. 1 By Mouth Once Daily 3)  Multivitamins   Tabs (Multiple Vitamin) .... Once Daily 4)  Prilosec 10 Mg  Cpdr (Omeprazole) .... Two Daily 5)  Calcium .... Take One By Mouth Two Times A Day 6)  Alprazolam 0.25 Mg Tabs (Alprazolam) .... 1/2 To 1 Once Daily For Anxiety As Needed 7)  Aspirin 81 Mg  Tabs (Aspirin) .... One Daily 8)  Lunesta 3 Mg Tabs (Eszopiclone) .... One By Mouth At Bedtime As Needed 9)  Nasonex 50 Mcg/act Susp (Mometasone Furoate) .Marland Kitchen.. 1-2 Each Nostril Qd 10)  Singulair 10 Mg Tabs (Montelukast Sodium) .Marland Kitchen.. 1 By Mouth Daily 11)  Azithromycin 250 Mg  Tabs (Azithromycin) .... 2 By  Mouth Today and Then 1 Daily For 4 Days  Allergies: 1)  ! Latex Gloves (Disposable Gloves) 2)  ! Augmentin  Review of Systems      See HPI General:  Denies chills and fever. Eyes:  Denies blurring. ENT:  Denies  difficulty swallowing, ear discharge, earache, and nasal congestion. CV:  Denies chest pain or discomfort. Resp:  Denies shortness of breath. GI:  Denies abdominal pain and change in bowel habits. Psych:  Denies anxiety, depression, suicidal thoughts/plans, thoughts of violence, unusual visions or sounds, and thoughts /plans of harming others.  Physical Exam  General:  GEN: Well-developed,well-nourished,in no acute distress; alert,appropriate and cooperative throughout examination  Ears:  copious cerumen bilaterally, improved s/p removal. TMs normal bilaterally. Mouth:  Oral mucosa and oropharynx without lesions or exudates.  Teeth in good repair. Lungs:  Normal respiratory effort, chest expands symmetrically. Lungs are clear to auscultation, no crackles or wheezes. Heart:  Normal rate and regular rhythm. S1 and S2 normal without gallop, murmur, click, rub or other extra sounds. Abdomen:  soft, non-tender, normal bowel sounds, no distention, no masses, no guarding, no abdominal hernia, and no inguinal hernia.   Extremities:  No clubbing, cyanosis, edema, or deformity noted with normal full range of motion of all joints.   Psych:  Cognition and judgment appear intact. Alert and cooperative with normal attention span and concentration. No apparent delusions, illusions, hallucinations   Impression & Recommendations:  Problem # 1:  CERUMEN IMPACTION,  RECURRENT (ICD-380.4) Assessment Deteriorated Cerumen removal today, TMs look normal. Orders: Cerumen Impaction Removal ([address])  Problem # 2:  OSTEOPENIA (ICD-733.90) Assessment: Unchanged Order Dexa today. Taking Caltrate. Orders: Radiology Referral (Radiology)  Problem # 3:  HYPERLIPIDEMIA (ICD-272.4) Assessment: Unchanged Doing well on Lipitor. Her updated medication list for this problem includes:    Lipitor 20 Mg Tabs (Atorvastatin calcium) .Marland Kitchen... 1 by mouth once daily  Problem # 4:  Preventive Health Care (ICD-V70.0) Assessment:  Comment Only Set up mammogram today. Make appt for CPX.  Complete Medication List: 1)  Zoloft 50 Mg Tabs (Sertraline hcl) .Marland Kitchen.. 1 by mouth daily 2)  Lipitor 20 Mg Tabs (Atorvastatin calcium) .Marland Kitchen.. 1 by mouth once daily 3)  Multivitamins Tabs (Multiple vitamin) .... Once daily 4)  Prilosec 10 Mg Cpdr (Omeprazole) .... Two daily 5)  Calcium  .... Take one by mouth two times a day 6)  Alprazolam 0.25 Mg Tabs (Alprazolam) .... 1/2 to 1 once daily for anxiety as needed 7)  Aspirin 81 Mg Tabs (Aspirin) .... One daily 8)  Lunesta 3 Mg Tabs (Eszopiclone) .... One by mouth at bedtime as needed 9)  Nasonex 50 Mcg/act Susp (Mometasone furoate) .Marland Kitchen.. 1-2 each nostril qd 10)  Singulair 10 Mg Tabs (Montelukast sodium) .Marland Kitchen.. 1 by mouth daily 11)  Azithromycin 250 Mg Tabs (Azithromycin) .... 2 by  mouth today and then 1 daily for 4 days  Patient Instructions: 1)  Please set up an appointment for a complete physical next monday. 2)  Stop by to see Shirlee Limerick on your way out and she will set up your mammogram and bone density test.  Current Allergies (reviewed today): ! LATEX GLOVES (DISPOSABLE GLOVES) ! AUGMENTIN

## 2010-11-04 NOTE — Progress Notes (Signed)
Summary: refill request for ambien  Phone Note Refill Request Message from:  Fax from Pharmacy  Refills Requested: Medication #1:  AMBIEN 10 MG TABS 1 tab by mouth at bedtime as needed insomnia   Last Refilled: 02/17/2010 Faxed request from [facility], [phone].  Initial call taken by: Lowella Petties CMA,  March 19, 2010 11:37 AM  Follow-up for Phone Call        Rx called to pharmacy Follow-up by: Linde Gillis CMA Duncan Dull),  March 19, 2010 11:46 AM    Prescriptions: AMBIEN 10 MG TABS (ZOLPIDEM TARTRATE) 1 tab by mouth at bedtime as needed insomnia  #30 x 0   Entered and Authorized by:   Ruthe Mannan MD   Signed by:   Ruthe Mannan MD on 03/19/2010   Method used:   Telephoned to ...       [facility] [address] (retail)       [address]       [place], [place]  [address]       Ph: [phone]       Fax: [phone]   RxID:   [id number]

## 2010-11-04 NOTE — Progress Notes (Signed)
Summary: Wants antibiotic  Phone Note Call from Patient Call back at Home Phone [phone]   Caller: Patient Call For: Shaune Leeks MD Summary of Call: Saw Dr. Dayton Martes on Monday and was diagnosed with a sinus infection.  Was given Nasonex and was told that if it's not working to call back.  It's not working and patient would like antibiotic.  Please advise.  Uses [facility] Initial call taken by: Linde Gillis CMA Duncan Dull),  January 10, 2010 8:56 AM    New/Updated Medications: AZITHROMYCIN 250 MG  TABS (AZITHROMYCIN) 2 by  mouth today and then 1 daily for 4 days Prescriptions: AZITHROMYCIN 250 MG  TABS (AZITHROMYCIN) 2 by  mouth today and then 1 daily for 4 days  #6 x 0   Entered and Authorized by:   Ruthe Mannan MD   Signed by:   Ruthe Mannan MD on 01/10/2010   Method used:   Electronically to        [facility] [address] ([facility]ail)       [address]       [place], [place]  [address]       Ph: [phone]       Fax: [phone]   RxID:   [id number]

## 2010-11-04 NOTE — Progress Notes (Signed)
Summary: xanax  Phone Note Refill Request Message from:  [facility] on October 31, 2009 4:26 PM  Refills Requested: Medication #1:  ALPRAZOLAM 0.25 MG TABS 1/2 to 1 once daily for anxiety as needed   Supply Requested: 1 month [facility] [address] [phone]   Method Requested: Telephone to Pharmacy Initial call taken by: Benny Lennert CMA Duncan Dull),  October 31, 2009 4:27 PM  Follow-up for Phone Call        Rx called to pharmacy Follow-up by: Linde Gillis CMA Duncan Dull),  November 01, 2009 10:41 AM    Prescriptions: ALPRAZOLAM 0.25 MG TABS (ALPRAZOLAM) 1/2 to 1 once daily for anxiety as needed  #30 x 0   Entered and Authorized by:   Shaune Leeks MD   Signed by:   Shaune Leeks MD on 10/31/2009   Method used:   Telephoned to ...       [facility] [address]ail)       [address]       [place], [place]  [address]       Ph: [phone]       Fax: [phone]   RxID:   [id number]

## 2010-11-04 NOTE — Progress Notes (Signed)
Summary: refill request for ambien  Phone Note Refill Request Message from:  Fax from Pharmacy  Refills Requested: Medication #1:  AMBIEN 10 MG TABS 1 tab by mouth at bedtime as needed insomnia   Last Refilled: 04/15/2010 Faxed request from [facility], [phone].  Initial call taken by: Lowella Petties CMA,  May 14, 2010 11:30 AM  Follow-up for Phone Call        Rx called to pharmacy Follow-up by: [place]e Gillis CMA Duncan Dull),  May 14, 2010 12:03 PM    Prescriptions: AMBIEN 10 MG TABS (ZOLPIDEM TARTRATE) 1 tab by mouth at bedtime as needed insomnia  #30 x 0   Entered and Authorized by:   Ruthe Mannan MD   Signed by:   Ruthe Mannan MD on 05/14/2010   Method used:   Telephoned to ...       [facility] [address]ail)       [address], [address]       [place], [place]  [address]       Ph: [phone]       Fax: [phone]   RxID:   [id number]

## 2010-11-04 NOTE — Progress Notes (Signed)
Summary: Ambien 10mg  refill  Phone Note Refill Request Call back at [phone] Message from:  [facility] on October 31, 2009 4:29 PM  Refills Requested: Medication #1:  AMBIEN 10 MG TABS one tab by mouth at bedtime as needed for difficulty sleeping...to be used sparingly.   Last Refilled: 10/03/2009 [facility] request refill Ambien. Please advise.    Method Requested: Electronic Initial call taken by: Lewanda Rife LPN,  October 31, 2009 4:29 PM  Follow-up for Phone Call        Medication phoned to [facility] pharmacy as instructed. Lewanda Rife LPN  November 01, 2009 9:19 AM     Prescriptions: AMBIEN 10 MG TABS (ZOLPIDEM TARTRATE) one tab by mouth at bedtime as needed for difficulty sleeping...to be used sparingly.  #30 x 0   Entered and Authorized by:   Shaune Leeks MD   Signed by:   Shaune Leeks MD on 10/31/2009   Method used:   Telephoned to ...       [facility] [address]retail)       [address], [address]       [place], [place]  [address]       Ph: [phone]       Fax: [phone]   RxID:   [id number]

## 2010-11-04 NOTE — Assessment & Plan Note (Signed)
Summary: knee pain/alc   Vital Signs:  Patient profile:   70 year old female Height:      57 inches Weight:      113.4 pounds BMI:     24.63 Temp:     98.1 degrees F oral Pulse rate:   80 / minute Pulse rhythm:   regular BP sitting:   110 / 80  (left arm) Cuff size:   regular  Vitals Entered By: Benny Lennert CMA Duncan Dull) (Feb 03, 2010 12:36 PM)   History of Present Illness: Chief complaint knee pain  70 year old female:  L knee: Knee hurting when standing and when up on her knee and doing a lot.  No specific injury No mechanical locking up of the joint No symptomatic giving way  L shoulder pain that has been ongoing for few weeks there is no history of trauma or accident. The patient denies neck pain or radicular symptoms. Denies dislocation, subluxation, separation of the shoulder. The patient does complain of pain in the overhead plane.  Medications Tried: none Ice or Heat: none Tried PT: yes with R RTC tendinopathy  Prior shoulder Injury: R  Also continued R maxillary sinusitis  Allergies: 1)  ! Latex Gloves (Disposable Gloves) 2)  ! Augmentin  Past History:  Past medical, surgical, family and social histories (including risk factors) reviewed, and no changes noted (except as noted below).  Past Medical History: Reviewed history from 04/19/2007 and no changes required. Depression Hyperlipidemia Osteopenia  Past Surgical History: Reviewed history from 07/18/2008 and no changes required. 12/14/01      DEXA - osteopenia   L/S and Hip   10/18/03 --unchanged, 9/09--unchanged  ~1963        Appendectomy     2/2 5/04    Carotid doppler (-) 06/29/03     CT brain (-) 6/04          Arterial doppler hands (-)  Family History: Reviewed history from 04/19/2007 and no changes required. Father: Died 51 Stroke Mother: Died 22 (00) Pneumonia, RAD, stroke Siblings: 4 brothers, 8 sisters CV:  (-) HBP:  (+) brothers and sisters DM:  (+) Daughter (57's),  sister CA:  (+) 2 sisters - hysterectomy?   1 sister - leukemia Depression:  (+) self Stroke:  (+) Father/Mother  Social History: Reviewed history from 06/27/2008 and no changes required. Never Smoked, (teens) Alcohol use-no Drug use-no Marital Status:  Widow  ~ 1/03 Children: 2 daughters, grandchildren--9/09 daughter having heart problems Occupation: working at [employer]  Review of Systems       REVIEW OF SYSTEMS  GEN: No systemic complaints, no fevers, chills, sweats, or other acute illnesses MSK: Detailed in the HPI GI: tolerating PO intake without difficulty Neuro: No numbness, parasthesias, or tingling associated. Otherwise the pertinent positives of the ROS are noted above.    Physical Exam  General:  GEN: Well-developed,well-nourished,in no acute distress; alert,appropriate and cooperative throughout examination HEENT: Normocephalic and atraumatic without obvious abnormalities. No apparent alopecia or balding. Ears, externally no deformities PULM: Breathing comfortably in no respiratory distress EXT: No clubbing, cyanosis, or edema PSYCH: Normally interactive. Cooperative during the interview. Pleasant. Friendly and conversant. Not anxious or depressed appearing. Normal, full affect.  Msk:  Shoulder: L Inspection: No muscle wasting or winging Ecchymosis/edema: neg  AC joint, scapula, clavicle: NT Cervical spine: NT, full ROM Spurling's: neg Abduction: full, 5/5 Flexion: full, 5/5 IR, full, lift-off: 5/5 ER at neutral: full, 5/5 AC crossover: neg Neer: pos  Hawkins: pos Drop Test: neg Empty Can: pos Supraspinatus insertion: mild-mod T Bicipital groove: NT Speed's: neg Yergason's: neg Sulcus sign: neg Scapular dyskinesis: none C5-T1 intact  Neuro: Sensation intact Grip 5/5  Additional Exam:  Gait: Normal heel toe pattern ROM: WNL Effusion: neg, baker's cyst Echymosis or edema: none Patellar tendon NT Painful PLICA: neg Patellar grind:  negative Medial and lateral patellar facet loading: negative medial and lateral joint lines:TTP medial posterior joint line Mcmurray's neg Flexion-pinch neg Varus and valgus stress: stable Lachman: neg Ant and Post drawer: neg Hip abduction, IR, ER: WNL Hip flexion str: 5/5 Hip abd: 5/5 Quad: 5/5 VMO atrophy:No Hamstring concentric and eccentric: 5/5    Impression & Recommendations:  Problem # 1:  KNEE PAIN, LEFT (ICD-719.46) Assessment New X-rays: AP Bilateral Weight-bearing, Weightbearing Lateral, Sunrise views Indication: knee pain Findings:  no evidence of significant OA. No fracture.  Medial focal joint line pain with baker's cyst. Suspect deg meniscal tear, but patient's exam otherwise not impressive  Conservative trial first, activity mod neoprene knee sleeve  oral nsaids  Her updated medication list for this problem includes:    Aspirin 81 Mg Tabs (Aspirin) ..... One daily    Diclofenac Sodium 75 Mg Tbec (Diclofenac sodium) .Marland Kitchen... 1 by mouth two times a day  Orders: Radiology other (Radiology Other)  Problem # 2:  ROTATOR CUFF INJURY, LEFT SHOULDER (ICD-726.10) Assessment: New Reviewed rehab like during her L RTC tendinopathy Retraining shoulder mechanics and function was emphasized to the patient with rehab done at least 5-6 days a week.  activity mod  Problem # 3:  SINUSITIS - ACUTE-NOS (ICD-461.9) focal R maxillary sinusitis failure of zpak with PCN allergy  LVQ  Her updated medication list for this problem includes:    Nasonex 50 Mcg/act Susp (Mometasone furoate) .Marland Kitchen... 1-2 each nostril qd    Levaquin 500 Mg Tabs (Levofloxacin) .Marland Kitchen... Take 1 tab by mouth daily  Complete Medication List: 1)  Zoloft 50 Mg Tabs (Sertraline hcl) .Marland Kitchen.. 1 by mouth daily 2)  Lipitor 20 Mg Tabs (Atorvastatin calcium) .Marland Kitchen.. 1 by mouth once daily 3)  Multivitamins Tabs (Multiple vitamin) .... Once daily 4)  Prilosec 10 Mg Cpdr (Omeprazole) .... Two daily 5)  Calcium  .... Take  one by mouth two times a day 6)  Alprazolam 0.25 Mg Tabs (Alprazolam) .... 1/2 to 1 once daily for anxiety as needed 7)  Aspirin 81 Mg Tabs (Aspirin) .... One daily 8)  Nasonex 50 Mcg/act Susp (Mometasone furoate) .Marland Kitchen.. 1-2 each nostril qd 9)  Vitamin B-12 500 Mcg Tabs (Cyanocobalamin) .... Take 1 tablet by mouth once a day 10)  Vitamin D 1000 Unit Tabs (Cholecalciferol) .... Take 1 tablet by mouth once a day 11)  Fish Oil Oil (Fish oil) .... Takes two daily 12)  Ambien 10 Mg Tabs (Zolpidem tartrate) .Marland Kitchen.. 1 tab by mouth at bedtime as needed insomnia 13)  Diclofenac Sodium 75 Mg Tbec (Diclofenac sodium) .Marland Kitchen.. 1 by mouth two times a day 14)  Levaquin 500 Mg Tabs (Levofloxacin) .... Take 1 tab by mouth daily  Patient Instructions: 1)  Neoprene knee sleeve (with posterior closed, mainly for compression) Prescriptions: LEVAQUIN 500 MG TABS (LEVOFLOXACIN) Take 1 tab by mouth daily  #10 x 0   Entered and Authorized by:   Hannah Beat MD   Signed by:   Hannah Beat MD on 02/03/2010   Method used:   Electronically to        [facility])  [address], [address]       [place], [place]  [address]       Ph: [phone]       Fax: [phone]   RxID:   [id number] DICLOFENAC SODIUM 75 MG TBEC (DICLOFENAC SODIUM) 1 by mouth two times a day  #60 x 1   Entered and Authorized by:   Hannah Beat MD   Signed by:   Hannah Beat MD on 02/03/2010   Method used:   Electronically to        [facility] ([facility]ail)       [address]       [place], [place]  [address]       Ph: [phone]       Fax: [phone]   RxID:   [id number]   Current Allergies (reviewed today): ! LATEX GLOVES (DISPOSABLE GLOVES) ! AUGMENTIN

## 2010-11-04 NOTE — Progress Notes (Signed)
Summary: pt wants to change to lunesta  Phone Note Call from Patient Call back at [facility] Phone [phone] Call back at [phone]   Caller: Patient Call For: Dr. Dayton Martes Summary of Call: Pt states the [place] doesnt work well for her lately.  She is asking if you will change her to [place].  Uses [facility] [address].   Initial call taken by: Lowella Petties CMA,  January 03, 2010 9:39 AM  Follow-up for Phone Call        Pt has appt with Dr Dayton Martes on Mon. Can be discussed then. Follow-up by: Shaune Leeks MD,  January 03, 2010 6:14 PM  Additional Follow-up for Phone Call Additional follow up Details #1::        Medication phoned to pharmacy.  Additional Follow-up by: Delilah Shan CMA (AAMA),  January 06, 2010 8:53 AM    New/Updated Medications: LUNESTA 3 MG TABS (ESZOPICLONE) one by mouth at bedtime as needed Prescriptions: LUNESTA 3 MG TABS (ESZOPICLONE) one by mouth at bedtime as needed  #30 x 0   Entered and Authorized by:   Ruthe Mannan MD   Signed by:   Delilah Shan CMA (AAMA) on 01/06/2010   Method used:   Handwritten   RxID:   [id number]

## 2010-11-04 NOTE — Progress Notes (Signed)
Summary: refill request for ambien  Phone Note Refill Request Message from:  Fax from Pharmacy  Refills Requested: Medication #1:  AMBIEN 10 MG TABS 1 tab by mouth at bedtime as needed insomnia   Last Refilled: 03/19/2010 Faxed request from [facility] [address], [phone].  Initial call taken by: Lowella Petties CMA, AAMA,  August 01, 2010 12:41 PM  Follow-up for Phone Call        Rx called to pharmacy Follow-up by: Linde Gillis CMA Duncan Dull),  August 01, 2010 12:47 PM    Prescriptions: AMBIEN 10 MG TABS (ZOLPIDEM TARTRATE) 1 tab by mouth at bedtime as needed insomnia  #30 x 0   Entered and Authorized by:   Ruthe Mannan MD   Signed by:   Ruthe Mannan MD on 08/01/2010   Method used:   Telephoned to ...       [facility] ([facility]ail)       [address]       [place], [place]  [address]       Ph: [phone]       Fax: [phone]   RxID:   [id number]

## 2010-11-04 NOTE — Letter (Signed)
Summary: Results Follow up Letter  [place] at [facility]  [address] [place], [place] [address]   Phone: [phone]  Fax: [phone]    03/14/2010 MRN: [id number]  [facility] [address] [place], [place]  [address]  Dear Ms. Pallone,  The following are the results of your recent test(s):  Test         Result    Pap Smear:        Normal _____  Not Normal _____ Comments: ______________________________________________________ Cholesterol: LDL(Bad cholesterol):         Your goal is less than:         HDL (Good cholesterol):       Your goal is more than: Comments:  ______________________________________________________ Mammogram:        Normal _____  Not Normal _____ Comments:  ___________________________________________________________________ Hemoccult:        Normal _____  Not normal _______ Comments:    _____________________________________________________________________ Other Tests: Your Bone Denisty is low.  Please call our office to schedule an appointment with Dr. Dayton Martes to discuss treatment options.  We were unable to reach you by telephone, please update your phone number when you call to schedule appointment.    We routinely do not discuss normal results over the telephone.  If you desire a copy of the results, or you have any questions about this information we can discuss them at your next office visit.   Sincerely,       Ruthe Mannan, MD

## 2010-11-04 NOTE — Progress Notes (Signed)
Summary:  CYCLOBENZAPRINE   Phone Note Refill Request Message from:  Fax from Pharmacy on July 11, 2010 11:27 AM  Refills Requested: Medication #1:  CYCLOBENZAPRINE HCL 10 MG  TABS 1 by mouth 2 times daily as needed for neck pain.   Last Refilled: 06/24/2010 [facility]ill [facility] [address] [phone].   Initial call taken by: Melody Comas,  July 11, 2010 11:28 AM    Prescriptions: CYCLOBENZAPRINE HCL 10 MG  TABS (CYCLOBENZAPRINE HCL) 1 by mouth 2 times daily as needed for neck pain.  #20 x 0   Entered and Authorized by:   Ruthe Mannan MD   Signed by:   Ruthe Mannan MD on 07/11/2010   Method used:   Electronically to        [facility]       [address], [address]       [place], [place]  [address]       Ph: [phone]       Fax: [phone]   RxID:   [id number]

## 2010-11-04 NOTE — Assessment & Plan Note (Signed)
Summary: Pain in right shoulder   Vital Signs:  Patient profile:   70 year old female Height:      57 inches Weight:      110 pounds BMI:     23.89 Temp:     98.3 degrees F oral Pulse rate:   80 / minute Pulse rhythm:   regular BP sitting:   122 / 80  (left arm) Cuff size:   regular  Vitals Entered By: Linde Gillis CMA Duncan Dull) (June 24, 2010 11:58 AM) CC: pain in right shoulder and back   History of Present Illness: 70 yo here for pain in right shoulder and back. Seen by Dr. Patsy Lager last year for similar symptoms, given cortisone shot and got better. No radiculopathy or weakness. Pain is in right trapezius area and goes up her neck. Improved with Vicodin but does not want to take that all the time. Has full range of motion of shoulder and neck but hurts when she moves her neck.   Current Medications (verified): 1)  Zoloft 50 Mg Tabs (Sertraline Hcl) .Marland Kitchen.. 1 By Mouth Daily 2)  Multivitamins   Tabs (Multiple Vitamin) .... Once Daily 3)  Prilosec 10 Mg  Cpdr (Omeprazole) .... Two Daily 4)  Calcium .... Take One By Mouth Two Times A Day 5)  Alprazolam 0.25 Mg Tabs (Alprazolam) .... 1/2 To 1 Once Daily For Anxiety As Needed 6)  Aspirin 81 Mg  Tabs (Aspirin) .... One Daily 7)  Vitamin B-12 500 Mcg  Tabs (Cyanocobalamin) .... Take 1 Tablet By Mouth Once A Day 8)  Vitamin D 1000 Unit  Tabs (Cholecalciferol) .... Take 1 Tablet By Mouth Once A Day 9)  Fish Oil   Oil (Fish Oil) .... Takes Two Daily 10)  Ambien 10 Mg Tabs (Zolpidem Tartrate) .Marland Kitchen.. 1 Tab By Mouth At Bedtime As Needed Insomnia 11)  Diclofenac Sodium 75 Mg Tbec (Diclofenac Sodium) .Marland Kitchen.. 1 By Mouth Two Times A Day 12)  Fluticasone Propionate 50 Mcg/act  Susp (Fluticasone Propionate) .... 2 Sprays Each Nostril Once Daily 13)  Cyclobenzaprine Hcl 10 Mg  Tabs (Cyclobenzaprine Hcl) .Marland Kitchen.. 1 By Mouth 2 Times Daily As Needed For Neck Pain. 14)  Simvastatin 20 Mg Tabs (Simvastatin) .Marland Kitchen.. 1 By Mouth At Bedtime  Allergies: 1)  !  Latex Gloves (Disposable Gloves) 2)  ! Augmentin  Past History:  Past Medical History: Last updated: May 04, 2007 Depression Hyperlipidemia Osteopenia  Past Surgical History: Last updated: 07/18/2008 12/14/01      DEXA - osteopenia   L/S and Hip   10/18/03 --unchanged, 9/09--unchanged  ~1963        Appendectomy     2/2 5/04    Carotid doppler (-) 06/29/03     CT brain (-) 6/04          Arterial doppler hands (-)  Family History: Last updated: 05/04/2007 Father: Died 44 Stroke Mother: Died 29 (00) Pneumonia, RAD, stroke Siblings: 4 brothers, 8 sisters CV:  (-) HBP:  (+) brothers and sisters DM:  (+) Daughter (46's), sister CA:  (+) 2 sisters - hysterectomy?   1 sister - leukemia Depression:  (+) self Stroke:  (+) Father/Mother  Social History: Last updated: 06/27/2008 Never Smoked, (teens) Alcohol use-no Drug use-no Marital Status:  Widow  ~ 1/03 Children: 2 daughters, grandchildren--9/09 daughter having heart problems Occupation: working at [employer]  Risk Factors: Caffeine Use: 1 (06/27/2008) Exercise: no (06/27/2008)  Risk Factors: Smoking Status: never (2007/05/04) Passive Smoke Exposure: no (06/27/2008)  Review  of Systems      See HPI MS:  Denies joint pain, joint redness, joint swelling, and muscle weakness. Neuro:  Denies disturbances in coordination and tingling.  Physical Exam  General:  GEN: Well-developed,well-nourished,in no acute distress; alert,appropriate and cooperative throughout examination  Mouth:  good dentition and no gingival abnormalities.   Msk:  Shoulder:Right   Cervical spine: NT, full ROM Spurling's: neg Abduction: full, 5/5 Flexion: full, 5/5 IR, full, lift-off: 5/5 Neg empty can, neg neers neg overhead raise Grip 5/5  Psych:  Cognition and judgment appear intact. Alert and cooperative with normal attention span and concentration. No apparent delusions, illusions, hallucinations   Impression &  Recommendations:  Problem # 1:  MUSCLE SPASM, TRAPEZIUS MUSCLE, RIGHT (ICD-728.85) Assessment New Will try short course of flexeril. Discussed stretches.   Pt to follow up if no improvement within a week.  Complete Medication List: 1)  Zoloft 50 Mg Tabs (Sertraline hcl) .Marland Kitchen.. 1 by mouth daily 2)  Multivitamins Tabs (Multiple vitamin) .... Once daily 3)  Prilosec 10 Mg Cpdr (Omeprazole) .... Two daily 4)  Calcium  .... Take one by mouth two times a day 5)  Alprazolam 0.25 Mg Tabs (Alprazolam) .... 1/2 to 1 once daily for anxiety as needed 6)  Aspirin 81 Mg Tabs (Aspirin) .... One daily 7)  Vitamin B-12 500 Mcg Tabs (Cyanocobalamin) .... Take 1 tablet by mouth once a day 8)  Vitamin D 1000 Unit Tabs (Cholecalciferol) .... Take 1 tablet by mouth once a day 9)  Fish Oil Oil (Fish oil) .... Takes two daily 10)  Ambien 10 Mg Tabs (Zolpidem tartrate) .Marland Kitchen.. 1 tab by mouth at bedtime as needed insomnia 11)  Diclofenac Sodium 75 Mg Tbec (Diclofenac sodium) .Marland Kitchen.. 1 by mouth two times a day 12)  Fluticasone Propionate 50 Mcg/act Susp (Fluticasone propionate) .... 2 sprays each nostril once daily 13)  Cyclobenzaprine Hcl 10 Mg Tabs (Cyclobenzaprine hcl) .Marland Kitchen.. 1 by mouth 2 times daily as needed for neck pain. 14)  Simvastatin 20 Mg Tabs (Simvastatin) .Marland Kitchen.. 1 by mouth at bedtime  Other Orders: Flu Vaccine 46yrs + (91478) Admin 1st Vaccine (29562)  Patient Instructions: 1)  Please make a follow up appointment in 3 months for labs- fasting lipid panel and hepatic panel (272.4) Prescriptions: SIMVASTATIN 20 MG TABS (SIMVASTATIN) 1 by mouth at bedtime  #90 x 3   Entered and Authorized by:   Ruthe Mannan MD   Signed by:   Ruthe Mannan MD on 06/24/2010   Method used:   Electronically to        [facility]       [address]       [place], [place]  [address]       Ph: [phone]       Fax: [phone]   RxID:   [id number] CYCLOBENZAPRINE  HCL 10 MG  TABS (CYCLOBENZAPRINE HCL) 1 by mouth 2 times daily as needed for neck pain.  #20 x 0   Entered and Authorized by:   Ruthe Mannan MD   Signed by:   Ruthe Mannan MD on 06/24/2010   Method used:   Electronically to        [facility] [address]       [address], [address]       [place], [place]  [address]  Ph: [phone]       Fax: [phone]   RxID:   [id number] FLUTICASONE PROPIONATE 50 MCG/ACT  SUSP (FLUTICASONE PROPIONATE) 2 sprays each nostril once daily  #1 vial x 3   Entered and Authorized by:   Ruthe Mannan MD   Signed by:   Ruthe Mannan MD on 06/24/2010   Method used:   Electronically to        [facility]       [address], [address]       [place], [place]  [address]       Ph: [phone]       Fax: [phone]   RxID:   [id number]   Current Allergies (reviewed today): ! LATEX GLOVES (DISPOSABLE GLOVES) ! AUGMENTIN   Immunizations Administered:  Influenza Vaccine # 1:    Vaccine Type: Fluvax 3+    Site: left deltoid    Mfr: GlaxoSmithKline    Dose: 0.5 ml    Route: IM    Given by: Linde Gillis CMA (AAMA)    Exp. Date: 04/04/2011    Lot #: [id number]    VIS given: 04/29/10 version given June 24, 2010.

## 2010-11-04 NOTE — Assessment & Plan Note (Signed)
Summary: ACUTE-ONLY TREAT FOR ALLEG-WILL EST WILL BEDSOLE AT LATER DAT...   Vital Signs:  Patient profile:   70 year old female Height:      57 inches Weight:      112 pounds BMI:     24.32 Temp:     98.7 degrees F oral Pulse rate:   72 / minute Pulse rhythm:   regular BP sitting:   102 / 60  (left arm) Cuff size:   regular  Vitals Entered By: Delilah Shan CMA Duncan Dull) (January 06, 2010 9:35 AM) CC: Treat for allergies, will establish with Dr.Bedsole at a later date.   History of Present Illness: 36 you here for worsening seasonal allergies.  Has always had issues with allergic rhinitis but feels this year is much worse. Will try allegra or claritin which will help for a few days but feels symptoms always come back. Sinus pressure at night over the past month is so bad, she has a hard time sleeping.   Nose is always running.  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 No fevers, chills, cough. No wheezing or SOB.  Has never tried an inhaled steroid.    Current Medications (verified): 1)  Zoloft 50 Mg Tabs (Sertraline Hcl) .Marland Kitchen.. 1 By Mouth Daily 2)  Lipitor 20 Mg Tabs (Atorvastatin Calcium) .Marland Kitchen.. 1 By Mouth Once Daily 3)  Multivitamins   Tabs (Multiple Vitamin) .... Once Daily 4)  Prilosec 10 Mg  Cpdr (Omeprazole) .... Two Daily 5)  Calcium .... Take One By Mouth Two Times A Day 6)  Alprazolam 0.25 Mg Tabs (Alprazolam) .... 1/2 To 1 Once Daily For Anxiety As Needed 7)  Aspirin 81 Mg  Tabs (Aspirin) .... One Daily 8)  Lunesta 3 Mg Tabs (Eszopiclone) .... One By Mouth At Bedtime As Needed 9)  Nasonex 50 Mcg/act Susp (Mometasone Furoate) .Marland Kitchen.. 1-2 Each Nostril Qd 10)  Singulair 10 Mg Tabs (Montelukast Sodium) .Marland Kitchen.. 1 By Mouth Daily  Allergies: 1)  ! Latex Gloves (Disposable Gloves) 2)  ! Augmentin  Review of Systems      See HPI General:  Denies chills and fatigue. ENT:  Complains of nasal  congestion, postnasal drainage, and sinus pressure; denies earache. Resp:  Denies cough, shortness of breath, sputum productive, and wheezing.  Physical Exam  General:  GEN: Well-developed,well-nourished,in no acute distress; alert,appropriate and cooperative throughout examination  Nose:  mucosal erythema and mucosal edema.   no airflow obstruction sinuses neg Mouth:  Oral mucosa and oropharynx without lesions or exudates.  Teeth in good repair. Lungs:  Normal respiratory effort, chest expands symmetrically. Lungs are clear to auscultation, no crackles or wheezes. Heart:  Normal rate and regular rhythm. S1 and S2 normal without gallop, murmur, click, rub or other extra sounds. Extremities:  No clubbing, cyanosis, edema, or deformity noted with normal full range of motion of all joints.   Psych:  Cognition and judgment appear intact. Alert and cooperative with normal attention span and concentration. No apparent delusions, illusions, hallucinations   Impression & Recommendations:  Problem # 1:  ALLERGIC RHINITIS (ICD-477.9) Assessment Deteriorated Time spent with patient 25 minutes, more than 50% of this time was spent counseling patient on different treatment options.  She is very reluctant to try inhaled medications, but also does not want to keep taking abx for sinus infections, which she believes truly stem from her allergies.  Has never tried an inhaled steroid  or Singulair.  Given samples of both.  Advised calling me in 2 weeks to let me know how symptoms are doing.  Pt in agreement with plan.  Her updated medication list for this problem includes:    Nasonex 50 Mcg/act Susp (Mometasone furoate) .Marland Kitchen... 1-2 each nostril qd  Complete Medication List: 1)  Zoloft 50 Mg Tabs (Sertraline hcl) .Marland Kitchen.. 1 by mouth daily 2)  Lipitor 20 Mg Tabs (Atorvastatin calcium) .Marland Kitchen.. 1 by mouth once daily 3)  Multivitamins Tabs (Multiple vitamin) .... Once daily 4)  Prilosec 10 Mg Cpdr (Omeprazole) .... Two  daily 5)  Calcium  .... Take one by mouth two times a day 6)  Alprazolam 0.25 Mg Tabs (Alprazolam) .... 1/2 to 1 once daily for anxiety as needed 7)  Aspirin 81 Mg Tabs (Aspirin) .... One daily 8)  Lunesta 3 Mg Tabs (Eszopiclone) .... One by mouth at bedtime as needed 9)  Nasonex 50 Mcg/act Susp (Mometasone furoate) .Marland Kitchen.. 1-2 each nostril qd 10)  Singulair 10 Mg Tabs (Montelukast sodium) .Marland Kitchen.. 1 by mouth daily  Patient Instructions: 1)  Try Nasonex.   Two sprays in each nostril daily.  Current Allergies (reviewed today): ! LATEX GLOVES (DISPOSABLE GLOVES) ! AUGMENTIN

## 2010-11-04 NOTE — Letter (Signed)
Summary: Out of Work  [facility] at [facility]  [address] [place], [place] [address]   Phone: [phone]  Fax: [phone]    November 18, 2009   Employee:  [name]    To Whom It May Concern:   Ms. Megan Hobbs is being treated for severe anxiety and back pain.  She will not be able to sit and concentrate for long periods of time.  Please excuse her from jury duty. If you need additional information, please feel free to contact our office.         Sincerely,    Ruthe Mannan MD

## 2010-11-04 NOTE — Progress Notes (Signed)
Summary: Rx Zoloft  Phone Note Refill Request Call back at [phone] Message from:  [facility] [address] on December 30, 2009 2:01 PM  Refills Requested: Medication #1:  ZOLOFT 50 MG TABS 1 by mouth daily   Last Refilled: 11/28/2009 Received faxed refill request please advise.   Method Requested: Telephone to Pharmacy Initial call taken by: Linde Gillis CMA Duncan Dull),  December 30, 2009 2:02 PM  Follow-up for Phone Call        ok to refill for 1 year  Call in to the pharmacy of their choice Call in #30, 11 refills. OR if they prefer a 90 day supply, #90 with 3 refills is OK, too Prescription instructions above  Follow-up by: Hannah Beat MD,  December 30, 2009 2:03 PM  Additional Follow-up for Phone Call Additional follow up Details #1::        Rx called to pharmacy Additional Follow-up by: Linde Gillis CMA Duncan Dull),  December 30, 2009 2:16 PM    Prescriptions: ZOLOFT 50 MG TABS (SERTRALINE HCL) 1 by mouth daily  #30 x 11   Entered by:   Linde Gillis CMA (AAMA)   Authorized by:   Hannah Beat MD   Signed by:   Linde Gillis CMA (AAMA) on 12/30/2009   Method used:   Telephoned to ...       [facility] (retail)       [address]       [place], [place]  [address]       Ph: [phone]       Fax: [phone]   RxID:   [id number]   Prior Medications: ZOLOFT 50 MG TABS (SERTRALINE HCL) 1 by mouth daily LIPITOR 20 MG TABS (ATORVASTATIN CALCIUM) 1 by mouth once daily MULTIVITAMINS   TABS (MULTIPLE VITAMIN) once daily PRILOSEC 10 MG  CPDR (OMEPRAZOLE) two daily CALCIUM () Take one by mouth two times a day AMBIEN 10 MG TABS (ZOLPIDEM TARTRATE) one tab by mouth at bedtime as needed for difficulty sleeping...to be used sparingly. ALPRAZOLAM 0.25 MG TABS (ALPRAZOLAM) 1/2 to 1 once daily for anxiety as needed ASPIRIN 81 MG  TABS (ASPIRIN) one daily CYCLOBENZAPRINE HCL 10 MG  TABS (CYCLOBENZAPRINE HCL) 1 by mouth 2 times daily as  needed for back pain Current Allergies: ! LATEX GLOVES (DISPOSABLE GLOVES) ! AUGMENTIN

## 2010-11-04 NOTE — Progress Notes (Signed)
Summary: Megan Hobbs  Phone Note Refill Request Message from:  Fax from Pharmacy on July 08, 2010 1:14 PM  Refills Requested: Medication #1:  AMBIEN 10 MG TABS 1 tab by mouth at bedtime as needed insomnia   Last Refilled: 06/11/2010 [facility]ill [facility] from [facility] [address] [phone]  Initial call taken by: Melody Comas,  July 08, 2010 1:14 PM  Follow-up for Phone Call        Rx called to pharmacy Follow-up by: Linde Gillis CMA Duncan Dull),  July 09, 2010 8:41 AM    Prescriptions: AMBIEN 10 MG TABS (ZOLPIDEM TARTRATE) 1 tab by mouth at bedtime as needed insomnia  #30 x 0   Entered and Authorized by:   Ruthe Mannan MD   Signed by:   Ruthe Mannan MD on 07/08/2010   Method used:   Telephoned to ...       [facility] (retail)       [address]       [place], [place]  [address]       Ph: [phone]       Fax: [phone]   RxID:   [id number]

## 2010-11-04 NOTE — Miscellaneous (Signed)
Summary: BONE DENSITY  Clinical Lists Changes  Orders: Added new Test order of T-Bone Densitometry (77080) - Signed Added new Test order of T-Lumbar Vertebral Assessment (77082) - Signed 

## 2010-11-04 NOTE — Progress Notes (Signed)
Summary: refill request for [facility]  Phone Note Refill Request Call back at [facility] Phone [phone] Message from:  Patient  Refills Requested: Medication #1:  AMBIEN 10 MG TABS 1 tab by mouth at bedtime as needed insomnia Phoned request from pt, please send to [facility].  Initial call taken by: Lowella Petties CMA, AAMA,  September 01, 2010 9:52 AM  Follow-up for Phone Call        Rx called to pharmacy, patient advised as instructed via telephone. Follow-up by: Linde Gillis CMA Duncan Dull),  September 01, 2010 10:16 AM    Prescriptions: AMBIEN 10 MG TABS (ZOLPIDEM TARTRATE) 1 tab by mouth at bedtime as needed insomnia  #30 x 0   Entered and Authorized by:   Ruthe Mannan MD   Signed by:   Ruthe Mannan MD on 09/01/2010   Method used:   Telephoned to ...       [facility] [address])       [address], [address]       [place], [place]  [address]       Ph: [phone]       Fax: [phone]   RxID:   [id number]

## 2010-11-04 NOTE — Progress Notes (Signed)
Summary: refill request for ambien  Phone Note Refill Request Message from:  Fax from Pharmacy  Refills Requested: Medication #1:  AMBIEN 10 MG TABS 1 tab by mouth at bedtime as needed insomnia   Last Refilled: 03/19/2010 Faxed request from [facility] [address], [phone].  Initial call taken by: Lowella Petties CMA,  April 15, 2010 12:01 PM  Follow-up for Phone Call        Rx called to pharmacy Follow-up by: Linde Gillis CMA Duncan Dull),  April 15, 2010 1:40 PM    Prescriptions: AMBIEN 10 MG TABS (ZOLPIDEM TARTRATE) 1 tab by mouth at bedtime as needed insomnia  #30 x 0   Entered and Authorized by:   Ruthe Mannan MD   Signed by:   Ruthe Mannan MD on 04/15/2010   Method used:   Telephoned to ...       [facility] [address]       [address], [address]       [place], [place]  [address]       Ph: [phone]       Fax: [phone]   RxID:   [id number]

## 2010-11-04 NOTE — Progress Notes (Signed)
Summary: refill request for ambien  Phone Note Refill Request Message from:  [facility]  Refills Requested: Medication #1:  AMBIEN 10 MG TABS one tab by mouth at bedtime as needed for difficulty sleeping...to be used sparingly.   Last Refilled: 11/28/2008 [facility] request from [facility].  Initial call taken by: Lowella Petties CMA,  December 24, 2009 12:27 PM  Follow-up for Phone Call        px written on EMR for call in  Follow-up by: Judith Part MD,  December 24, 2009 1:22 PM  Additional Follow-up for Phone Call Additional follow up Details #1::        Called to [facility]. Additional Follow-up by: Lowella Petties CMA,  December 24, 2009 3:13 PM    Prescriptions: AMBIEN 10 MG TABS (ZOLPIDEM TARTRATE) one tab by mouth at bedtime as needed for difficulty sleeping...to be used sparingly.  #30 x 0   Entered and Authorized by:   Judith Part MD   Signed by:   Melody Comas on 12/24/2009   Method used:   Telephoned to ...       [facility] [address]       [address]       [place], [place]  [address]       Ph: [phone]       Fax: [phone]   RxID:   [id number]

## 2010-11-04 NOTE — Progress Notes (Signed)
Summary: Rx Alprazolam and Ambien  Phone Note Refill Request Call back at [phone] Message from:  [facility] on November 28, 2009 10:58 AM  Refills Requested: Medication #1:  AMBIEN 10 MG TABS one tab by mouth at bedtime as needed for difficulty sleeping...to be used sparingly.   Last Refilled: 11/01/2009  Medication #2:  ALPRAZOLAM 0.25 MG TABS 1/2 to 1 once daily for anxiety as needed   Last Refilled: 11/01/2009 Received e-scribe refill request. Billie's patient.   Method Requested: Telephone to Pharmacy Initial call taken by: Sydell Axon LPN,  November 28, 2009 10:59 AM  Follow-up for Phone Call        Needs to get established with new doctor for further refills per Dr. Hetty Ely. Follow-up by: Sydell Axon LPN,  November 28, 2009 3:00 PM  Additional Follow-up for Phone Call Additional follow up Details #1::        Unable to reach patient by telephone. Rx called to pharmacist Florentina Addison) and she will let patient know that she needs to call the office to get an appt. with a new doctor. Additional Follow-up by: Sydell Axon LPN,  November 28, 2009 3:07 PM    Prescriptions: ALPRAZOLAM 0.25 MG TABS (ALPRAZOLAM) 1/2 to 1 once daily for anxiety as needed  #30 x 0   Entered by:   Ruthe Mannan MD   Authorized by:   Shaune Leeks MD   Signed by:   Ruthe Mannan MD on 11/28/2009   Method used:   Handwritten   RxID:   [id number] AMBIEN 10 MG TABS (ZOLPIDEM TARTRATE) one tab by mouth at bedtime as needed for difficulty sleeping...to be used sparingly.  #30 x 0   Entered by:   Ruthe Mannan MD   Authorized by:   Shaune Leeks MD   Signed by:   Ruthe Mannan MD on 11/28/2009   Method used:   Handwritten   RxID:   [id number]

## 2010-11-04 NOTE — Progress Notes (Signed)
Summary: Rx Ambien  Phone Note Refill Request Call back at [phone] Message from:  [facility] on Feb 17, 2010 1:03 PM  Refills Requested: Medication #1:  AMBIEN 10 MG TABS 1 tab by mouth at bedtime as needed insomnia   Last Refilled: 01/20/2010 Received faxed refill request please advise.   Method Requested: Telephone to Pharmacy Initial call taken by: Linde Gillis CMA Duncan Dull),  Feb 17, 2010 1:04 PM  Follow-up for Phone Call        Rx called to pharmacy Follow-up by: Linde Gillis CMA Duncan Dull),  Feb 17, 2010 2:10 PM    Prescriptions: AMBIEN 10 MG TABS (ZOLPIDEM TARTRATE) 1 tab by mouth at bedtime as needed insomnia  #30 x 0   Entered and Authorized by:   Ruthe Mannan MD   Signed by:   Ruthe Mannan MD on 02/17/2010   Method used:   Telephoned to ...       [facility] [address]       [address]       [place], [place]  [address]       Ph: [phone]       Fax: [phone]   RxID:   [id number]

## 2010-11-06 NOTE — Progress Notes (Signed)
Summary: prior auth denied for lidoderm  Phone Note From Pharmacy   Caller: [facility] Summary of Call: Prior auth denied for lidoderm.  Letter is in your in box.            Lowella Petties CMA, AAMA  September 26, 2010 8:37 AM   Follow-up for Phone Call        please notify patient about denied prior auth for lidoderm patches, will likely need OV with PCP to discuss this if she desires to fill. Follow-up by: Eustaquio Boyden  MD,  September 28, 2010 11:30 PM  Additional Follow-up for Phone Call Additional follow up Details #1::        Left message on voicemail to return my call. Kim Dance CMA Duncan Dull)  September 30, 2010 10:51 AM   Advised pt, she said she will discuss this at her next visit. Additional Follow-up by: Lowella Petties CMA, AAMA,  September 30, 2010 3:35 PM

## 2010-11-06 NOTE — Progress Notes (Signed)
Summary: Lidoderm patch refill  Phone Note Refill Request Message from:  Patient on September 23, 2010 11:42 AM  Patient requests a refill on Lidoderm patches. They are not on her med list. Please advise. Not urgent!   Method Requested: Electronic Initial call taken by: Janee Morn CMA Duncan Dull),  September 23, 2010 11:42 AM  Follow-up for Phone Call        Pt says this is somewhat urgent because she doesnt have any and she needs this for back pain.  She was given this by a doctor in [place].  [facility] [place]. Follow-up by: Lowella Petties CMA, AAMA,  September 23, 2010 3:47 PM  Additional Follow-up for Phone Call Additional follow up Details #1::        sent in.  please notify patinet. Additional Follow-up by: Eustaquio Boyden  MD,  September 23, 2010 10:32 PM    Additional Follow-up for Phone Call Additional follow up Details #2::    Message left notifying patient Follow-up by: Janee Morn CMA Duncan Dull),  September 24, 2010 8:16 AM  New/Updated Medications: LIDODERM 5 % PTCH (LIDOCAINE) apply one patch to back for max 12 hours /day, #30 Prescriptions: LIDODERM 5 % PTCH (LIDOCAINE) apply one patch to back for max 12 hours /day, #30  #1 x 1   Entered and Authorized by:   Eustaquio Boyden  MD   Signed by:   Eustaquio Boyden  MD on 09/23/2010   Method used:   [facility]ronically to        [facility] [address]       [address], [address]       [place], [place]  [address]       Ph: [phone]       Fax: [phone]   RxID:   [id number]

## 2010-11-06 NOTE — Assessment & Plan Note (Signed)
Summary: KNEE PAIN/DLO   Vital Signs:  Patient profile:   70 year old female Weight:      111.50 pounds Temp:     97.6 degrees F oral Pulse rate:   80 / minute Pulse rhythm:   regular BP sitting:   124 / 76  (left arm) Cuff size:   regular  Vitals Entered By: Selena Batten Dance CMA Duncan Dull) (September 22, 2010 9:16 AM) CC: Left knee pain and check head   History of Present Illness: CC: L knee pain  1. intermittent.  Has been hurting for several months.  Doesn't remember any incinting event or injury.  Hasn't tried anything yet.  Hurts on inside of knee.  Does stand at work for 7-8 hours /day.  Also with lower back pain (x years).  Doesn't like to take NSAIDs because make her hyper.  Tylenol/advil does ok.  pain sometimes travels from knee to ankle, lateral side.  Went to Dr. Patsy Lager, told may have retained fluid, didn't want to inject.  Told could be arthirits as well.    2. scalp itchy - occiput - red and scaly.  using head and shoulders shampoo.  also selenium shampoo.  has tried tar shampoo, which hasn't helped.  very itchy.  previously given steroid cream by derm which helped.  brings scripts of previous meds.  pt also requests refill of xanax and lidocaine patches that were prescribed by previous provider.  Current Medications (verified): 1)  Zoloft 50 Mg Tabs (Sertraline Hcl) .Marland Kitchen.. 1 By Mouth Daily 2)  Multivitamins   Tabs (Multiple Vitamin) .... Once Daily 3)  Prilosec 10 Mg  Cpdr (Omeprazole) .... Two Daily 4)  Calcium .... Take One By Mouth Two Times A Day 5)  Aspirin 81 Mg  Tabs (Aspirin) .... One Daily 6)  Vitamin B-12 500 Mcg  Tabs (Cyanocobalamin) .... Take 1 Tablet By Mouth Once A Day 7)  Vitamin D 1000 Unit  Tabs (Cholecalciferol) .... Take 1 Tablet By Mouth Once A Day 8)  Fish Oil   Oil (Fish Oil) .... Takes Two Daily 9)  Ambien 10 Mg Tabs (Zolpidem Tartrate) .Marland Kitchen.. 1 Tab By Mouth At Bedtime As Needed Insomnia 10)  Naprosyn 500 Mg Tabs (Naproxen) .... Take One Twice Daily For  Knee X 7 Days Then As Needed 11)  Fluticasone Propionate 50 Mcg/act  Susp (Fluticasone Propionate) .... 2 Sprays Each Nostril Once Daily 12)  Simvastatin 20 Mg Tabs (Simvastatin) .Marland Kitchen.. 1 By Mouth At Bedtime 13)  Selenium Sulfide 2.5 % Lotn (Selenium Sulfide) .... Use As Directed On Scalp 14)  Betamethasone Dipropionate Aug 0.05 % Oint (Betamethasone Dipropionate Aug) .... Use As Directed On Scalp  Allergies: 1)  ! Latex Gloves (Disposable Gloves) 2)  ! Augmentin  Past History:  Past Medical History: Last updated: 04/19/2007 Depression Hyperlipidemia Osteopenia  Social History: Last updated: 06/27/2008 Never Smoked, (teens) Alcohol use-no Drug use-no Marital Status:  Widow  ~ 1/03 Children: 2 daughters, grandchildren--9/09 daughter having heart problems Occupation: working at [employer]  Review of Systems       per HPI  Physical Exam  General:  Well-developed,well-nourished,in no acute distress; alert,appropriate and cooperative throughout examination Msk:  bilateral knees - no deformity, effusion, tenderness, FROM, Neg drawer test, no pain with testing valgus/varus, negative Mcmurray's, no PF grind, patella normal tracking.   Pulses:  2+ DP/PT Extremities:  no edal edema, no calf tenderness Neurologic:  sensation intact, DTRs 2+ bilat Skin:  occipital scalp with patches of erythematous scaling  Impression & Recommendations:  Problem # 1:  KNEE PAIN, LEFT (ICD-719.46) conservative therapy.  likely some component of arthritis.  ice, NSAID trial x 2 wks.  RTC if worsening or doesn't improve. The following medications were removed from the medication list:    Cyclobenzaprine Hcl 10 Mg Tabs (Cyclobenzaprine hcl) .Marland Kitchen... 1 by mouth 2 times daily as needed for neck pain. Her updated medication list for this problem includes:    Aspirin 81 Mg Tabs (Aspirin) ..... One daily    Naprosyn 500 Mg Tabs (Naproxen) .Marland Kitchen... Take one twice daily for knee x 7 days then as  needed  Problem # 2:  SKIN RASH (ICD-782.1) seborrheic dermatitis vs eczematous variant.  treat with selenium sulfide and steroid lotion.  consider referral to derm if not better. Her updated medication list for this problem includes:    Selenium Sulfide 2.5 % Lotn (Selenium sulfide) ..... Use as directed on scalp    Betamethasone Dipropionate Aug 0.05 % Oint (Betamethasone dipropionate aug) ..... Use as directed on scalp  Complete Medication List: 1)  Zoloft 50 Mg Tabs (Sertraline hcl) .Marland Kitchen.. 1 by mouth daily 2)  Multivitamins Tabs (Multiple vitamin) .... Once daily 3)  Prilosec 10 Mg Cpdr (Omeprazole) .... Two daily 4)  Calcium  .... Take one by mouth two times a day 5)  Aspirin 81 Mg Tabs (Aspirin) .... One daily 6)  Vitamin B-12 500 Mcg Tabs (Cyanocobalamin) .... Take 1 tablet by mouth once a day 7)  Vitamin D 1000 Unit Tabs (Cholecalciferol) .... Take 1 tablet by mouth once a day 8)  Fish Oil Oil (Fish oil) .... Takes two daily 9)  Ambien 10 Mg Tabs (Zolpidem tartrate) .Marland Kitchen.. 1 tab by mouth at bedtime as needed insomnia 10)  Naprosyn 500 Mg Tabs (Naproxen) .... Take one twice daily for knee x 7 days then as needed 11)  Fluticasone Propionate 50 Mcg/act Susp (Fluticasone propionate) .... 2 sprays each nostril once daily 12)  Simvastatin 20 Mg Tabs (Simvastatin) .Marland Kitchen.. 1 by mouth at bedtime 13)  Selenium Sulfide 2.5 % Lotn (Selenium sulfide) .... Use as directed on scalp 14)  Betamethasone Dipropionate Aug 0.05 % Oint (Betamethasone dipropionate aug) .... Use as directed on scalp 15)  Alprazolam 0.25 Mg Tabs (Alprazolam) .... 1/2 to 1 qdaily as needed anxiety  Patient Instructions: 1)  For head - use shampoo and steroid ointment. 2)  For knee - prescription naprosyn for 1 wk to see if we can help.  Ice knee.  elevate after work. 3)  If not better, let us know. 4)  Good to meet you today.  Call clinic with quesitons. Prescriptions: ALPRAZOLAM 0.25 MG TABS (ALPRAZOLAM) 1/2 to 1 qdaily as  needed anxiety  #30 x 0   Entered and Authorized by:   Eustaquio Boyden  MD   Signed by:   Eustaquio Boyden  MD on 09/22/2010   Method used:   Telephoned to ...       [facility] [address]       [address], [address]       [place], [place]  [address]       Ph: [phone]       Fax: [phone]   RxID:   [id number] BETAMETHASONE DIPROPIONATE AUG 0.05 % OINT (BETAMETHASONE DIPROPIONATE AUG) use as directed on scalp  #1 x 1   Entered and Authorized by:   Eustaquio Boyden  MD   Signed by:  Eustaquio Boyden  MD on 09/22/2010   Method used:   Electronically to        [facility] [address]       [address], [address]       [place], [place]  [address]       Ph: [phone]       Fax: [phone]   RxID:   [id number] SELENIUM SULFIDE 2.5 % LOTN (SELENIUM SULFIDE) use as directed on scalp  #1 x 1   Entered and Authorized by:   Eustaquio Boyden  MD   Signed by:   Eustaquio Boyden  MD on 09/22/2010   Method used:   Electronically to        [facility] [address]       [address], [address]       [place], [place]  [address]       Ph: [phone]       Fax: [phone]   RxID:   [id number] NAPROSYN 500 MG TABS (NAPROXEN) take one twice daily for knee x 7 days then as needed  #30 x 0   Entered and Authorized by:   Eustaquio Boyden  MD   Signed by:   Eustaquio Boyden  MD on 09/22/2010   Method used:   [facility]ronically to        [facility] [address]       [address], [address]       [place], [place]  [address]       Ph: [phone]       Fax: [phone]   RxID:   [id number]    Orders Added: 1)  Est. Patient Level III [[address]]    Current Allergies (reviewed today): ! LATEX GLOVES (DISPOSABLE GLOVES) ! AUGMENTIN

## 2010-11-06 NOTE — Progress Notes (Signed)
Summary: prior auth needed for lidoderm  Phone Note From Pharmacy   Caller: [facility]  [phone] Summary of Call: Prior Berkley Harvey is needed for lidoderm, form is in your in box.               Lowella Petties CMA, AAMA  September 25, 2010 9:45 AM   Follow-up for Phone Call        filled out.  may not qualify. Follow-up by: Eustaquio Boyden  MD,  September 25, 2010 1:26 PM  Additional Follow-up for Phone Call Additional follow up Details #1::        Form faxed Additional Follow-up by: Janee Morn CMA Duncan Dull),  September 25, 2010 1:48 PM

## 2010-11-06 NOTE — Progress Notes (Signed)
Summary: Rx Ambien  Phone Note Refill Request Call back at [phone] Message from:  [facility] on September 30, 2010 8:29 AM  Refills Requested: Medication #1:  AMBIEN 10 MG TABS 1 tab by mouth at bedtime as needed insomnia   Last Refilled: 09/01/2010 Received faxed refill request please advise.   Method Requested: Telephone to Pharmacy Initial call taken by: Linde Gillis CMA Duncan Dull),  September 30, 2010 8:30 AM  Follow-up for Phone Call        Pt says she cant wait for Dr. Dayton Martes to respond to this request.               Lowella Petties CMA, AAMA  September 30, 2010 2:39 PM   Additional Follow-up for Phone Call Additional follow up Details #1::        may call in. Additional Follow-up by: Eustaquio Boyden  MD,  September 30, 2010 6:44 PM    Additional Follow-up for Phone Call Additional follow up Details #2::    Rx called in as directed.  Follow-up by: Janee Morn CMA Duncan Dull),  October 01, 2010 8:46 AM  Prescriptions: AMBIEN 10 MG TABS (ZOLPIDEM TARTRATE) 1 tab by mouth at bedtime as needed insomnia  #30 x 0   Entered by:   Janee Morn CMA (AAMA)   Authorized by:   Eustaquio Boyden  MD   Signed by:   Janee Morn CMA (AAMA) on 10/01/2010   Method used:   Telephoned to ...       [facility] [address]       [address], [address]       [place], [place]  [address]       Ph: [phone]       Fax: [phone]   RxID:   [id number]

## 2010-11-06 NOTE — Medication Information (Signed)
Summary: Denial for Lidoderm/[employer]  Denial for Lidoderm/[employer]   Imported By: Lanelle Bal 10/14/2010 11:20:59  _____________________________________________________________________  External Attachment:    Type:   Image     Comment:   External Document

## 2010-11-06 NOTE — Progress Notes (Signed)
Summary: Megan Hobbs  Phone Note Refill Request Message from:  Fax from Pharmacy on October 28, 2010 1:15 PM  Refills Requested: Medication #1:  AMBIEN 10 MG TABS 1 tab by mouth at bedtime as needed insomnia   Last Refilled: 10/01/2010 [facility] [address] [phone]  Initial call taken by: Melody Comas,  October 28, 2010 1:17 PM  Follow-up for Phone Call        Rx called to pharmacy Follow-up by: Linde Gillis CMA Duncan Dull),  October 29, 2010 8:07 AM    Prescriptions: AMBIEN 10 MG TABS (ZOLPIDEM TARTRATE) 1 tab by mouth at bedtime as needed insomnia  #30 x 0   Entered and Authorized by:   Ruthe Mannan MD   Signed by:   Ruthe Mannan MD on 10/29/2010   Method used:   Telephoned to ...       [facility] [address]       [address], [address]       [place], [place]  [address]       Ph: [phone]       Fax: [phone]   RxID:   [id number]

## 2010-11-06 NOTE — Medication Information (Signed)
Summary: Prior Authorization for Lidoderm/[employer]  Prior Authorization for Lidoderm/[employer]   Imported By: Lanelle Bal 10/07/2010 10:16:24  _____________________________________________________________________  External Attachment:    Type:   Image     Comment:   External Document

## 2010-11-06 NOTE — Progress Notes (Signed)
Summary: wants to go back on lipitor  Phone Note Call from Patient   Caller: Patient Summary of Call: Pt is asking to go back on lipitor, she said she had tried simvastatin for 3 months but that didnt work, so she wants lipitor sent to [facility]. Initial call taken by: Lowella Petties CMA, AAMA,  October 01, 2010 9:52 AM    New/Updated Medications: LIPITOR 20 MG TABS (ATORVASTATIN CALCIUM) Take 1 tab by mouth daily Prescriptions: LIPITOR 20 MG TABS (ATORVASTATIN CALCIUM) Take 1 tab by mouth daily  #90 x 3   Entered and Authorized by:   Ruthe Mannan MD   Signed by:   Ruthe Mannan MD on 10/01/2010   Method used:   Electronically to        [facility] (retail)       [address]       [place], [place]  [address]       Ph: [phone]       Fax: [phone]   RxID:   [id number]

## 2010-11-06 NOTE — Assessment & Plan Note (Signed)
Summary: L LEG,KNEE PAIN/CLE   Vital Signs:  Patient profile:   70 year old female Height:      57 inches Weight:      115 pounds BMI:     24.98 Temp:     98.1 degrees F oral Pulse rate:   80 / minute Pulse rhythm:   regular BP sitting:   118 / 80  (left arm) Cuff size:   regular  Vitals Entered By: Benny Lennert CMA Duncan Dull) (October 15, 2010 12:40 PM)  History of Present Illness: Chief complaint left leg and knee pain  70 year old female:  pleasant patient, remember well. She presents with left pain, medially as well as a posterior popliteal cyst, that has been ongoing now for minimally 9 months. Saw her back in May of 2011, with was concern for potential internal derangement, but the patient wanted to proceed conservatively.  Approximately 2 months ago, she tripped and twisted her knee while she was at the [employer] and this hasn't worsened pain since that time.  She does have some mild degree of swelling. Pain is throbbing and hurting almost all the time, mostly medially, and also has pain in her posterior Baker's cyst.  No prior knee injuries.  Prior knee x-rays, independently reviewed the report, read by myself and radiology at the time of x-ray, weightbearing films. No evidence of significant osteoarthritic changes.  Now paitient is having nagging left leg pain all the time, and pain with going up and down the stairs.  Feels a posterior lump.  Working 34-35 hours a week. Standing as a Conservation officer, nature all the time.  Allergies: 1)  ! Latex Gloves (Disposable Gloves) 2)  ! Augmentin  Past History:  Past medical, surgical, family and social histories (including risk factors) reviewed, and no changes noted (except as noted below).  Past Medical History: Reviewed history from 04/19/2007 and no changes required. Depression Hyperlipidemia Osteopenia  Past Surgical History: Reviewed history from 07/18/2008 and no changes required. 12/14/01      DEXA - osteopenia   L/S and  Hip   10/18/03 --unchanged, 9/09--unchanged  ~1963        Appendectomy     2/2 5/04    Carotid doppler (-) 06/29/03     CT brain (-) 6/04          Arterial doppler hands (-)  Family History: Reviewed history from 04/19/2007 and no changes required. Father: Died 52 Stroke Mother: Died 53 (00) Pneumonia, RAD, stroke Siblings: 4 brothers, 8 sisters CV:  (-) HBP:  (+) brothers and sisters DM:  (+) Daughter (79's), sister CA:  (+) 2 sisters - hysterectomy?   1 sister - leukemia Depression:  (+) self Stroke:  (+) Father/Mother  Social History: Reviewed history from 06/27/2008 and no changes required. Never Smoked, (teens) Alcohol use-no Drug use-no Marital Status:  Widow  ~ 1/03 Children: 2 daughters, grandchildren--9/09 daughter having heart problems Occupation: working at [employer]  Review of Systems       REVIEW OF SYSTEMS  GEN: No systemic complaints, no fevers, chills, sweats, or other acute illnesses MSK: Detailed in the HPI GI: tolerating PO intake without difficulty Neuro: No numbness, parasthesias, or tingling associated. Otherwise the pertinent positives of the ROS are noted above.    Physical Exam  General:  GEN: Well-developed,well-nourished,in no acute distress; alert,appropriate and cooperative throughout examination HEENT: Normocephalic and atraumatic without obvious abnormalities. No apparent alopecia or balding. Ears, externally no deformities PULM: Breathing comfortably in no  respiratory distress EXT: No clubbing, cyanosis, or edema PSYCH: Normally interactive. Cooperative during the interview. Pleasant. Friendly and conversant. Not anxious or depressed appearing. Normal, full affect.  Msk:  left knee: Full extension. Flexion to 115.  Medial joint line pain. No lateral joint line pain.  No pain with patellar manipulation, patellar facet pain, or superior or inferior grind testing.  No patellar instability.  Stable MCL and LCL on  examination. Negative Lachman examination. Negative posterior and anterior drawer testing.  Positive bounce home tests. Positive meniscus, McMurray's test, and positive flexion pinch test.  prominent popliteal cyst, left-sided.   Impression & Recommendations:  Problem # 1:  INTERNAL DERANGEMENT, LEFT KNEE (ICD-717.9) Assessment New  problem degenerative meniscal tear, with concern with recent exacerbation with trauma. Popliteal cyst is highly associated with posterior medial meniscal tear.  Discussed conservative versus surgical management with the patient. She decides to proceed conservatively at this point. Continued ibuprofen b.i.d. and will inject her knee with corticosteroid. I will follow her up in 3-4 weeks.  Knee Injection, LEFT Patient verbally consented to procedure. Risks, benefits, and alternatives explained. Sterilely prepped with betadine. Ethyl cholride used for anesthesia. 9 cc Lidocaine 1% mixed with 1 cc of Kenalog 40 mg injected using the anterolateral approach without difficulty. No complications with procedure and tolerated well. Patient had decreased pain post-injection.   Orders: Joint Aspirate / Injection, Large (20610) Kenalog 10mg  (4units) (J3301)  Problem # 2:  POPLITEAL CYST, LEFT (ICD-727.51) Assessment: New  Orders: Joint Aspirate / Injection, Large (20610) Kenalog 10mg  (4units) (J3301)  Complete Medication List: 1)  Zoloft 50 Mg Tabs (Sertraline hcl) .Marland Kitchen.. 1 by mouth daily 2)  Multivitamins Tabs (Multiple vitamin) .... Once daily 3)  Omeprazole 20 Mg Cpdr (Omeprazole) .Marland Kitchen.. 1 by mouth 30 minutes before breakfast 4)  Calcium  .... Take one by mouth two times a day 5)  Aspirin 81 Mg Tabs (Aspirin) .... One daily 6)  Vitamin B-12 500 Mcg Tabs (Cyanocobalamin) .... Take 1 tablet by mouth once a day 7)  Vitamin D 1000 Unit Tabs (Cholecalciferol) .... Take 1 tablet by mouth once a day 8)  Fish Oil Oil (Fish oil) .... Takes two daily 9)  Ambien 10 Mg Tabs  (Zolpidem tartrate) .Marland Kitchen.. 1 tab by mouth at bedtime as needed insomnia 10)  Naprosyn 500 Mg Tabs (Naproxen) .... Take one twice daily for knee x 7 days then as needed 11)  Fluticasone Propionate 50 Mcg/act Susp (Fluticasone propionate) .... 2 sprays each nostril once daily 12)  Selenium Sulfide 2.5 % Lotn (Selenium sulfide) .... Use as directed on scalp 13)  Betamethasone Dipropionate Aug 0.05 % Oint (Betamethasone dipropionate aug) .... Use as directed on scalp 14)  Alprazolam 0.25 Mg Tabs (Alprazolam) .... 1/2 to 1 qdaily as needed anxiety 15)  Lipitor 20 Mg Tabs (Atorvastatin calcium) .... Take 1 tab by mouth daily  Patient Instructions: 1)  f/u 3-4 weeks with me Prescriptions: OMEPRAZOLE 20 MG CPDR (OMEPRAZOLE) 1 by mouth 30 minutes before breakfast  #30 x 11   Entered and Authorized by:   Hannah Beat MD   Signed by:   Hannah Beat MD on 10/15/2010   Method used:   Electronically to        [facility])       [address]       [place], [place]  [address]       Ph: [phone]  Fax: [phone]   RxID:   [id number]    Orders Added: 1)  Est. Patient Level III [84696] 2)  Joint Aspirate / Injection, Large [20610] 3)  Kenalog 10mg  (4units) [J3301]    Current Allergies (reviewed today): ! LATEX GLOVES (DISPOSABLE GLOVES) ! AUGMENTIN

## 2010-11-24 ENCOUNTER — Encounter: Payer: Self-pay | Admitting: Family Medicine

## 2010-11-24 ENCOUNTER — Telehealth: Payer: Self-pay | Admitting: Family Medicine

## 2010-11-24 ENCOUNTER — Ambulatory Visit (INDEPENDENT_AMBULATORY_CARE_PROVIDER_SITE_OTHER): Payer: Medicare PPO | Admitting: Family Medicine

## 2010-11-24 DIAGNOSIS — M2390 Unspecified internal derangement of unspecified knee: Secondary | ICD-10-CM

## 2010-11-26 ENCOUNTER — Ambulatory Visit (INDEPENDENT_AMBULATORY_CARE_PROVIDER_SITE_OTHER): Payer: Medicare PPO | Admitting: Family Medicine

## 2010-11-26 ENCOUNTER — Encounter: Payer: Self-pay | Admitting: Family Medicine

## 2010-11-26 DIAGNOSIS — J018 Other acute sinusitis: Secondary | ICD-10-CM | POA: Insufficient documentation

## 2010-12-01 ENCOUNTER — Encounter: Payer: Self-pay | Admitting: Family Medicine

## 2010-12-01 ENCOUNTER — Telehealth: Payer: Self-pay | Admitting: Family Medicine

## 2010-12-02 NOTE — Progress Notes (Signed)
Summary: Megan Hobbs  Phone Note Refill Request Message from:  Patient on November 24, 2010 2:54 PM  Refills Requested: Medication #1:  AMBIEN 10 MG TABS 1 tab by mouth at bedtime as needed insomnia   Supply Requested: 1 month [facility]   Method Requested: Telephone to Pharmacy Initial call taken by: Benny Lennert CMA Duncan Dull),  November 24, 2010 2:54 PM  Follow-up for Phone Call        Rx called to pharmacy Follow-up by: Linde Gillis CMA Duncan Dull),  November 24, 2010 3:30 PM    Prescriptions: AMBIEN 10 MG TABS (ZOLPIDEM TARTRATE) 1 tab by mouth at bedtime as needed insomnia  #30 x 0   Entered and Authorized by:   Ruthe Mannan MD   Signed by:   Ruthe Mannan MD on 11/24/2010   Method used:   Telephoned to ...       [facility] [address])       [address]       [place], [place]  [address]       Ph: [phone]       Fax: [phone]   RxID:   [id number]

## 2010-12-02 NOTE — Assessment & Plan Note (Signed)
Summary: 1 month f/u JRT   Vital Signs:  Patient profile:   70 year old female Height:      57 inches Weight:      114 pounds BMI:     24.76 Temp:     98.7 degrees F oral Pulse rate:   80 / minute Pulse rhythm:   regular BP sitting:   118 / 82  (left arm) Cuff size:   regular  Vitals Entered By: Benny Lennert CMA Duncan Dull) (November 24, 2010 2:53 PM)  History of Present Illness: Chief complaint follow up knee  f/u left knee injury, twisting injury about 3 months ago.  s/p intraarticular injection.  Knee, has been improving. Pain has eased off and is doing better.  65% better. ROM is improved.   Was hurting her a lot for ? week post inj, but since has done a lot better.  Able to work on her feet 7-8 hours a day no effusion  primarily medial pain  REVIEW OF SYSTEMS  GEN: No systemic complaints, no fevers, chills, sweats, or other acute illnesses MSK: Detailed in the HPI GI: tolerating PO intake without difficulty Neuro: No numbness, parasthesias, or tingling associated. Otherwise the pertinent positives of the ROS are noted above.     Allergies: 1)  ! Latex Gloves (Disposable Gloves) 2)  ! Augmentin  Physical Exam  General:  GEN: Well-developed,well-nourished,in no acute distress; alert,appropriate and cooperative throughout examination HEENT: Normocephalic and atraumatic without obvious abnormalities. No apparent alopecia or balding. Ears, externally no deformities PULM: Breathing comfortably in no respiratory distress EXT: No clubbing, cyanosis, or edema PSYCH: Normally interactive. Cooperative during the interview. Pleasant. Friendly and conversant. Not anxious or depressed appearing. Normal, full affect.  Msk:  left knee: Full extension. Flexion to 125.  Medial joint line pain. No lateral joint line pain.  No pain with patellar manipulation, patellar facet pain, or superior or inferior grind testing.  No patellar instability.  Stable MCL and LCL on  examination. Negative Lachman examination. Negative posterior and anterior drawer testing.  negative bounce home tests, McMurray's test, and neg flexion pinch test.  prominent popliteal cyst, left-sided.   Impression & Recommendations:  Problem # 1:  INTERNAL DERANGEMENT, LEFT KNEE (ICD-717.9) Assessment Improved suspect healing medial meniscal injury. patient is doing much better, reinitiate exercise. recheck 2 mo.  Complete Medication List: 1)  Zoloft 50 Mg Tabs (Sertraline hcl) .Marland Kitchen.. 1 by mouth daily 2)  Multivitamins Tabs (Multiple vitamin) .... Once daily 3)  Omeprazole 20 Mg Cpdr (Omeprazole) .Marland Kitchen.. 1 by mouth 30 minutes before breakfast 4)  Calcium  .... Take one by mouth two times a day 5)  Aspirin 81 Mg Tabs (Aspirin) .... One daily 6)  Vitamin B-12 500 Mcg Tabs (Cyanocobalamin) .... Take 1 tablet by mouth once a day 7)  Vitamin D 1000 Unit Tabs (Cholecalciferol) .... Take 1 tablet by mouth once a day 8)  Fish Oil Oil (Fish oil) .... Takes two daily 9)  Ambien 10 Mg Tabs (Zolpidem tartrate) .Marland Kitchen.. 1 tab by mouth at bedtime as needed insomnia 10)  Naprosyn 500 Mg Tabs (Naproxen) .... Take one twice daily for knee x 7 days then as needed 11)  Fluticasone Propionate 50 Mcg/act Susp (Fluticasone propionate) .... 2 sprays each nostril once daily 12)  Selenium Sulfide 2.5 % Lotn (Selenium sulfide) .... Use as directed on scalp 13)  Betamethasone Dipropionate Aug 0.05 % Oint (Betamethasone dipropionate aug) .... Use as directed on scalp 14)  Alprazolam 0.25 Mg Tabs (  Alprazolam) .... 1/2 to 1 qdaily as needed anxiety 15)  Lipitor 20 Mg Tabs (Atorvastatin calcium) .... Take 1 tab by mouth daily  Patient Instructions: 1)  Take Naprosyn now with any knee pain 2)  recheck in 2 months   Orders Added: 1)  Est. Patient Level III [[address]]    Current Allergies (reviewed today): ! LATEX GLOVES (DISPOSABLE GLOVES) ! AUGMENTIN

## 2010-12-02 NOTE — Letter (Signed)
Summary: Out of Work  [facility]  [address] [place], [place] [address]   Phone: [phone]  Fax: [phone]    November 26, 2010   Employee:  Megan Hobbs    To Whom It May Concern:   For Medical reasons, please excuse the above named employee from work for the following dates:  Start:   11/26/2010  End:   11/28/2010  If you need additional information, please feel free to contact our office.         Sincerely,    Ruthe Mannan MD

## 2010-12-02 NOTE — Assessment & Plan Note (Signed)
Summary: COUGH,HA,BACK PAIN/CLE  [facility]   Vital Signs:  Patient profile:   70 year old female Height:      57 inches Weight:      114.25 pounds BMI:     24.81 Temp:     99.0 degrees F oral Pulse rate:   87 / minute Pulse rhythm:   regular BP sitting:   140 / 80  (right arm) Cuff size:   regular  Vitals Entered By: Linde Gillis CMA Duncan Dull) (November 26, 2010 12:15 PM) CC: cough, headache, back pain   History of Present Illness: 71 yo with one week of progressive URI symptoms.  Was here earlier this week to see Dr. Patsy Lager but at that time, symptoms were improving. Over past few days, worsening sinus pressure, malaise, subjective fever.  Upper back hurts when she coughs. No CP or SOB. Taking OTC decongestants.  Pt allergic to amoxicillin and states that Zpack upset her stomach.  Current Medications (verified): 1)  Zoloft 50 Mg Tabs (Sertraline Hcl) .Marland Kitchen.. 1 By Mouth Daily 2)  Multivitamins   Tabs (Multiple Vitamin) .... Once Daily 3)  Omeprazole 20 Mg Cpdr (Omeprazole) .Marland Kitchen.. 1 By Mouth 30 Minutes Before Breakfast 4)  Calcium .... Take One By Mouth Two Times A Day 5)  Aspirin 81 Mg  Tabs (Aspirin) .... One Daily 6)  Vitamin B-12 500 Mcg  Tabs (Cyanocobalamin) .... Take 1 Tablet By Mouth Once A Day 7)  Vitamin D 1000 Unit  Tabs (Cholecalciferol) .... Take 1 Tablet By Mouth Once A Day 8)  Fish Oil   Oil (Fish Oil) .... Takes Two Daily 9)  Ambien 10 Mg Tabs (Zolpidem Tartrate) .Marland Kitchen.. 1 Tab By Mouth At Bedtime As Needed Insomnia 10)  Naprosyn 500 Mg Tabs (Naproxen) .... Take One Twice Daily For Knee X 7 Days Then As Needed 11)  Fluticasone Propionate 50 Mcg/act  Susp (Fluticasone Propionate) .... 2 Sprays Each Nostril Once Daily 12)  Selenium Sulfide 2.5 % Lotn (Selenium Sulfide) .... Use As Directed On Scalp 13)  Betamethasone Dipropionate Aug 0.05 % Oint (Betamethasone Dipropionate Aug) .... Use As Directed On Scalp 14)  Alprazolam 0.25 Mg Tabs (Alprazolam) .... 1/2 To 1 Qdaily As  Needed Anxiety 15)  Lipitor 20 Mg Tabs (Atorvastatin Calcium) .... Take 1 Tab By Mouth Daily 16)  Levaquin 500 Mg Tabs (Levofloxacin) .... Take 1 Tab By Mouth Daily X 5 Days 17)  Tessalon Perles 100 Mg  Caps (Benzonatate) .Marland Kitchen.. 1 Tab By Mouth Three Times A Day As Needed Cough  Allergies: 1)  ! Latex Gloves (Disposable Gloves) 2)  ! Augmentin  Past History:  Past Medical History: Last updated: 04-24-07 Depression Hyperlipidemia Osteopenia  Past Surgical History: Last updated: 07/18/2008 12/14/01      DEXA - osteopenia   L/S and Hip   10/18/03 --unchanged, 9/09--unchanged  ~1963        Appendectomy     2/2 5/04    Carotid doppler (-) 06/29/03     CT brain (-) 6/04          Arterial doppler hands (-)  Family History: Last updated: 24-Apr-2007 Father: Died 34 Stroke Mother: Died 50 (00) Pneumonia, RAD, stroke Siblings: 4 brothers, 8 sisters CV:  (-) HBP:  (+) brothers and sisters DM:  (+) Daughter (4's), sister CA:  (+) 2 sisters - hysterectomy?   1 sister - leukemia Depression:  (+) self Stroke:  (+) Father/Mother  Social History: Last updated: 06/27/2008 Never Smoked, (teens) Alcohol use-no Drug use-no  Marital Status:  Widow  ~ 1/03 Children: 2 daughters, grandchildren--9/09 daughter having heart problems Occupation: working at [employer]  Risk Factors: Caffeine Use: 1 (06/27/2008) Exercise: no (06/27/2008)  Risk Factors: Smoking Status: never (04/19/2007) Passive Smoke Exposure: no (06/27/2008)  Review of Systems      See HPI General:  Complains of chills and fever. ENT:  Complains of nasal congestion, sinus pressure, and sore throat. Resp:  Complains of cough; denies shortness of breath, sputum productive, and wheezing.  Physical Exam  General:  GEN: Well-developed,well-nourished,in no acute distress; alert,appropriate and cooperative throughout examination Congested by non toxic appearing. VSS Nose:  boggy turbinates, sinsues +/-  TTP Mouth:  +PND Lungs:  Normal respiratory effort, chest expands symmetrically. Lungs are clear to auscultation, no crackles or wheezes. Heart:  Normal rate and regular rhythm. S1 and S2 normal without gallop, murmur, click, rub or other extra sounds. Extremities:  no edema Psych:  Cognition and judgment appear intact. Alert and cooperative with normal attention span and concentration. No apparent delusions, illusions, hallucinations   Impression & Recommendations:  Problem # 1:  OTHER ACUTE SINUSITIS (ICD-461.8) Assessment New Given duration and progression of symptoms, will treat for bacterial sinusitis with short course of Levaquin. Continue Flonase. Tessalon perles as needed cough. Her updated medication list for this problem includes:    Fluticasone Propionate 50 Mcg/act Susp (Fluticasone propionate) .Marland Kitchen... 2 sprays each nostril once daily    Levaquin 500 Mg Tabs (Levofloxacin) .Marland Kitchen... Take 1 tab by mouth daily x 5 days    Tessalon Perles 100 Mg Caps (Benzonatate) .Marland Kitchen... 1 tab by mouth three times a day as needed cough  Complete Medication List: 1)  Zoloft 50 Mg Tabs (Sertraline hcl) .Marland Kitchen.. 1 by mouth daily 2)  Multivitamins Tabs (Multiple vitamin) .... Once daily 3)  Omeprazole 20 Mg Cpdr (Omeprazole) .Marland Kitchen.. 1 by mouth 30 minutes before breakfast 4)  Calcium  .... Take one by mouth two times a day 5)  Aspirin 81 Mg Tabs (Aspirin) .... One daily 6)  Vitamin B-12 500 Mcg Tabs (Cyanocobalamin) .... Take 1 tablet by mouth once a day 7)  Vitamin D 1000 Unit Tabs (Cholecalciferol) .... Take 1 tablet by mouth once a day 8)  Fish Oil Oil (Fish oil) .... Takes two daily 9)  Ambien 10 Mg Tabs (Zolpidem tartrate) .Marland Kitchen.. 1 tab by mouth at bedtime as needed insomnia 10)  Naprosyn 500 Mg Tabs (Naproxen) .... Take one twice daily for knee x 7 days then as needed 11)  Fluticasone Propionate 50 Mcg/act Susp (Fluticasone propionate) .... 2 sprays each nostril once daily 12)  Selenium Sulfide 2.5 % Lotn  (Selenium sulfide) .... Use as directed on scalp 13)  Betamethasone Dipropionate Aug 0.05 % Oint (Betamethasone dipropionate aug) .... Use as directed on scalp 14)  Alprazolam 0.25 Mg Tabs (Alprazolam) .... 1/2 to 1 qdaily as needed anxiety 15)  Lipitor 20 Mg Tabs (Atorvastatin calcium) .... Take 1 tab by mouth daily 16)  Levaquin 500 Mg Tabs (Levofloxacin) .... Take 1 tab by mouth daily x 5 days 17)  Tessalon Perles 100 Mg Caps (Benzonatate) .Marland Kitchen.. 1 tab by mouth three times a day as needed cough Prescriptions: TESSALON PERLES 100 MG  CAPS (BENZONATATE) 1 tab by mouth three times a day as needed cough  #20 x 0   Entered and Authorized by:   Ruthe Mannan MD   Signed by:   Ruthe Mannan MD on 11/26/2010   Method used:   Electronically to  [facility] [address])       [address], [address]       [place], [place]  [address]       Ph: [phone]       Fax: [phone]   RxID:   [id number] LEVAQUIN 500 MG TABS (LEVOFLOXACIN) Take 1 tab by mouth daily x 5 days  #5 x 0   Entered and Authorized by:   Ruthe Mannan MD   Signed by:   Ruthe Mannan MD on 11/26/2010   Method used:   Electronically to        [facility] [address])       [address]       [place], [place]  [address]       Ph: [phone]       Fax: [phone]   RxID:   [id number]    Orders Added: 1)  Est. Patient Level III [[address]]    Current Allergies (reviewed today): ! LATEX GLOVES (DISPOSABLE GLOVES) ! AUGMENTIN

## 2010-12-04 ENCOUNTER — Telehealth: Payer: Self-pay | Admitting: Family Medicine

## 2010-12-04 ENCOUNTER — Encounter: Payer: Self-pay | Admitting: Family Medicine

## 2010-12-11 NOTE — Letter (Signed)
Summary: Out of Work  [facility]  [address] [place], [place] [address]   Phone: [phone]  Fax: [phone]    December 01, 2010   Employee:  [name]    To Whom It May Concern:   For Medical reasons, please excuse the above named employee from work for the following dates:  Start:   11/29/2010  End:   May return to work on Wednesday December 03, 2010.  If you need additional information, please feel free to contact our office.         Sincerely,       Dr. Ruthe Mannan

## 2010-12-11 NOTE — Progress Notes (Signed)
Summary: not any better  Phone Note Call from Patient Call back at Home Phone [phone]   Caller: Patient Call For: Ruthe Mannan MD Summary of Call: Pt was seen last week for cough, URI.  Finished antibiotic, not any better.  Cough is stil very bad, has sweats and chills, headache, sore throat, weak.  She is asking if she should continue on antibiotics for awhile longer.  The cough medicine she has is not helping.  [facility]ses [facility].  Needs note to be out of work for a few more days.  She was supposed to have worked saturday but [employer]. Initial call taken by: Lowella Petties CMA, AAMA,  December 01, 2010 12:07 PM  Follow-up for Phone Call        levaquin is quite strong, I would not recommend another abx at this point.  Often takes symptoms a while to resolve. We can send in a stronger cough medicine to her pharmacy (with codeine) if she would like. Ok to give her a note out of work for Saturday, Monday, Tuesday. Ruthe Mannan MD  December 01, 2010 12:09 PM  Patient advised as instructed via telephone.  She would like the stronger cough medicine.  Note printed out for work, she will pick it up later today.  Linde Gillis CMA Duncan Dull)  December 01, 2010 12:18 PM   Rx called to pharmacy, note for work left at front desk. Follow-up by: Linde Gillis CMA Duncan Dull),  December 01, 2010 12:33 PM    New/Updated Medications: Sandria Senter ER 8-10 MG/5ML LQCR (CHLORPHENIRAMINE-HYDROCODONE) 5 ml by mouth at bedtime as needed cough Prescriptions: TUSSIONEX PENNKINETIC ER 8-10 MG/5ML LQCR (CHLORPHENIRAMINE-HYDROCODONE) 5 ml by mouth at bedtime as needed cough  #5 ounces x 0   Entered and Authorized by:   Ruthe Mannan MD   Signed by:   Linde Gillis CMA (AAMA) on 12/01/2010   Method used:   Telephoned to ...       [facility] [address]       [address]       [place], [place]  [address]       Ph: [phone]       Fax: [phone]   RxID:    [id number]

## 2010-12-11 NOTE — Progress Notes (Signed)
Summary: FMLA forms   Phone Note Call from Patient   Caller: Patient Call For: Megan Hobbs Summary of Call: Patient dropped off FMLA forms to be filled out. Forms are on your desk. She is requesting that it be faxed to [phone].  Initial call taken by: Melody Comas,  December 01, 2010 4:28 PM  Follow-up for Phone Call        Dr. Dayton Martes request specific dates that patient wants on the FMLA paper work, called and left a message at her home number and on cell phone voicemail for her to return call.  Linde Gillis CMA Duncan Dull)  December 01, 2010 4:43 PM   Spoke with patient and the dates for the paper work should be 11/26/2010 through 12/03/2010.  Forms in your IN box.  Linde Gillis CMA Duncan Dull)  December 01, 2010 4:50 PM   Additional Follow-up for Phone Call Additional follow up Details #1::        in my box. Megan Hobbs  December 02, 2010 7:34 AM  Form faxed to [phone].  Left message on machine at home for patient to stop by our office and pay the $20 fee and she can pick up a copy of the paper work if needed.  Send to [employer] to be scanned.  Additional Follow-up by: Linde Gillis CMA (AAMA),  December 02, 2010 8:10 AM

## 2010-12-11 NOTE — Letter (Signed)
Summary: Generic Letter   at [facility]  [address] [place], [place] [address]   Phone: [phone]  Fax: [phone]    12/04/2010  MICHA ERCK [address] [place], [place]  [address]  [place]  Dear Ms. Moisan,    To Whom It May Concern:    Ms. Creely may return to work today without restrictions.  Any questions please call our office.       Sincerely,       Dr. Ruthe Mannan

## 2010-12-11 NOTE — Progress Notes (Signed)
Summary: needs note for work- waiting in Lobbyist Note Call from Patient   Caller: Patient Call For: Ruthe Mannan MD Summary of Call: Pt needs a note for work stating she can go back without restrictions, she is waiting for this in the lobby.               Lowella Petties CMA, AAMA  December 04, 2010 8:35 AM  Per Dr. Dayton Martes note written and given to patient.  Linde Gillis CMA Duncan Dull)  December 04, 2010 9:08 AM

## 2010-12-17 ENCOUNTER — Ambulatory Visit: Payer: Medicare PPO | Admitting: Family Medicine

## 2010-12-18 ENCOUNTER — Encounter: Payer: Self-pay | Admitting: Family Medicine

## 2010-12-18 ENCOUNTER — Ambulatory Visit (INDEPENDENT_AMBULATORY_CARE_PROVIDER_SITE_OTHER)
Admission: RE | Admit: 2010-12-18 | Discharge: 2010-12-18 | Disposition: A | Discharge status: Home / Self Care | Payer: Medicare PPO | Source: Ambulatory Visit | Attending: Family Medicine | Admitting: Family Medicine

## 2010-12-18 ENCOUNTER — Other Ambulatory Visit: Payer: Self-pay | Admitting: Family Medicine

## 2010-12-18 ENCOUNTER — Telehealth: Payer: Self-pay | Admitting: Family Medicine

## 2010-12-18 ENCOUNTER — Ambulatory Visit (INDEPENDENT_AMBULATORY_CARE_PROVIDER_SITE_OTHER): Payer: Medicare PPO | Admitting: Family Medicine

## 2010-12-18 DIAGNOSIS — R05 Cough: Secondary | ICD-10-CM

## 2010-12-18 DIAGNOSIS — R059 Cough, unspecified: Secondary | ICD-10-CM | POA: Insufficient documentation

## 2010-12-18 DIAGNOSIS — M549 Dorsalgia, unspecified: Secondary | ICD-10-CM

## 2010-12-18 DIAGNOSIS — R300 Dysuria: Secondary | ICD-10-CM

## 2010-12-19 ENCOUNTER — Encounter: Payer: Self-pay | Admitting: Family Medicine

## 2010-12-23 NOTE — Progress Notes (Signed)
Summary: Rx Ambien   Phone Note Refill Request Message from:  Patient on December 18, 2010 2:14 PM  Refills Requested: Medication #1:  AMBIEN 10 MG TABS 1 tab by mouth at bedtime as needed insomnia   Last Refilled: 11/24/2010 Patient is requesting a refill.  She stated that she will run out on Sunday and our office will be closed.  Please advise.   Method Requested: Telephone to Pharmacy Initial call taken by: Nikki Thorpe CMA (AAMA),  December 18, 2010 2:16 PM  Follow-up for Phone Call        Rx called to pharmacy Follow-up by: Nikki Thorpe CMA (AAMA),  December 18, 2010 2:48 PM    Prescriptions: AMBIEN 10 MG TABS (ZOLPIDEM TARTRATE) 1 tab by mouth at bedtime as needed insomnia  #30 x 0   Entered and Authorized by:   Sorah Falkenstein MD   Signed by:   Naitik Hermann MD on 12/18/2010   Method used:   Telephoned to ...       [facility] [address] (retail)       [address]       [place], [place]  [address]       Ph: [phone]       Fax: [phone]   RxID:   [id number]

## 2010-12-23 NOTE — Assessment & Plan Note (Signed)
Summary: COUGH,CHILLS,VERY TIRED/CLE  HUMANA   Vital Signs:  Patient profile:   70 year old female Height:      57 inches Weight:      115.50 pounds BMI:     25.08 Temp:     97.5 degrees F oral Pulse rate:   72 / minute Pulse rhythm:   regular BP sitting:   130 / 72  (right arm) Cuff size:   regular  Vitals Entered By: Linde Gillis CMA Duncan Dull) (December 18, 2010 12:12 PM) CC: cough, chills, tired   History of Present Illness: 70 yo here for follow up sinusitis.  Has multiple complaints today.  Saw her on 2/22, given 5 day course of Levaquin. Pt says sinus pressure is gone but still has terrible cough and has chills.   No CP or SOB.    Back hurts.  No dysuria or hematuria. Works at [employer], feels that this is making her back pain worse.  Since she started having these sinusitis symptoms, she is very tired.  No fevers.    Had an episode of diarrhea last week. No abdominal pain or diarrhea now. No nausea or vomiting.   Current Medications (verified): 1)  Zoloft 50 Mg Tabs (Sertraline Hcl) .Marland Kitchen.. 1 By Mouth Daily 2)  Multivitamins   Tabs (Multiple Vitamin) .... Once Daily 3)  Omeprazole 20 Mg Cpdr (Omeprazole) .Marland Kitchen.. 1 By Mouth 30 Minutes Before Breakfast 4)  Calcium .... Take One By Mouth Two Times A Day 5)  Aspirin 81 Mg  Tabs (Aspirin) .... One Daily 6)  Vitamin B-12 500 Mcg  Tabs (Cyanocobalamin) .... Take 1 Tablet By Mouth Once A Day 7)  Vitamin D 1000 Unit  Tabs (Cholecalciferol) .... Take 1 Tablet By Mouth Once A Day 8)  Fish Oil   Oil (Fish Oil) .... Takes Two Daily 9)  Ambien 10 Mg Tabs (Zolpidem Tartrate) .Marland Kitchen.. 1 Tab By Mouth At Bedtime As Needed Insomnia 10)  Naprosyn 500 Mg Tabs (Naproxen) .... Take One Twice Daily For Knee X 7 Days Then As Needed 11)  Fluticasone Propionate 50 Mcg/act  Susp (Fluticasone Propionate) .... 2 Sprays Each Nostril Once Daily 12)  Selenium Sulfide 2.5 % Lotn (Selenium Sulfide) .... Use As Directed On Scalp 13)  Betamethasone Dipropionate  Aug 0.05 % Oint (Betamethasone Dipropionate Aug) .... Use As Directed On Scalp 14)  Alprazolam 0.25 Mg Tabs (Alprazolam) .... 1/2 To 1 Qdaily As Needed Anxiety 15)  Lipitor 20 Mg Tabs (Atorvastatin Calcium) .... Take 1 Tab By Mouth Daily 16)  Tessalon Perles 100 Mg  Caps (Benzonatate) .Marland Kitchen.. 1 Tab By Mouth Three Times A Day As Needed Cough 17)  Tussionex Pennkinetic Er 8-10 Mg/87ml Lqcr (Chlorpheniramine-Hydrocodone) .... 5 Ml By Mouth At Bedtime As Needed Cough  Allergies: 1)  ! Latex Gloves (Disposable Gloves) 2)  ! Augmentin  Past History:  Past Medical History: Last updated: 2007-05-01 Depression Hyperlipidemia Osteopenia  Past Surgical History: Last updated: 07/18/2008 12/14/01      DEXA - osteopenia   L/S and Hip   10/18/03 --unchanged, 9/09--unchanged  ~1963        Appendectomy     2/2 5/04    Carotid doppler (-) 06/29/03     CT brain (-) 6/04          Arterial doppler hands (-)  Family History: Last updated: May 01, 2007 Father: Died 55 Stroke Mother: Died 7 (00) Pneumonia, RAD, stroke Siblings: 4 brothers, 8 sisters CV:  (-) HBP:  (+) brothers and sisters  DM:  (+) Daughter (30's), sister CA:  (+) 2 sisters - hysterectomy?   1 sister - leukemia Depression:  (+) self Stroke:  (+) Father/Mother  Social History: Last updated: 06/27/2008 Never Smoked, (teens) Alcohol use-no Drug use-no Marital Status:  Widow  ~ 1/03 Children: 2 daughters, grandchildren--9/09 daughter having heart problems Occupation: working at [employer]  Risk Factors: Caffeine Use: 1 (06/27/2008) Exercise: no (06/27/2008)  Risk Factors: Smoking Status: never (04/19/2007) Passive Smoke Exposure: no (06/27/2008)  Review of Systems      See HPI General:  Complains of chills; denies fever. CV:  Denies chest pain or discomfort. Resp:  Complains of cough and wheezing; denies shortness of breath and sputum productive. GU:  Denies dysuria.  Physical Exam  General:  GEN:  Well-developed,well-nourished,in no acute distress; alert,appropriate and cooperative throughout examination non toxic appearing VSS Mouth:  +PND Lungs:  Normal respiratory effort, chest expands symmetrically. Lungs are clear to auscultation, no crackles or wheezes. Heart:  Normal rate and regular rhythm. S1 and S2 normal without gallop, murmur, click, rub or other extra sounds. Abdomen:  Bowel sounds positive,abdomen soft and non-tender without masses, organomegaly or hernias noted. Msk:  No CVA tenderness.    Detailed Back/Spine Exam  Lumbosacral Exam:  Inspection-deformity:    Normal Palpation-spinal tenderness:  Normal Range of Motion:    Forward Flexion:   60 degrees    Hyperextension:   25 degrees    Schober's:        >6 cm    Right Lateral Bend:   25 degrees    Left Lateral Bend:   25 degrees Squatting:  normal Lying Straight Leg Raise:    Right:  negative    Left:  negative Sitting Straight Leg Raise:    Right:  negative    Left:  negative Reverse Straight Leg Raise:    Right:  negative    Left:  negative Contralateral Straight Leg Raise:    Right:  negative    Left:  negative Sciatic Notch:    There is no sciatic notch tenderness. Toe Walking:    Right:  normal    Left:  normal Heel Walking:    Right:  normal    Left:  normal Patrick's Maneuver:    Right:  negative    Left:  negative Fabere Test:    Right:  negative    Left:  negative   Impression & Recommendations:  Problem # 1:  COUGH (ICD-786.2) Assessment Deteriorated Discussed with pt that cough can linger for weeks after URI but she says she does not feel "right." Will get a CXR to make sure we are not missing a pulmonary process. Lungs clear on exam.   Orders: T-2 View CXR (71020TC)  Problem # 2:  BACK PAIN, ACUTE (ICD-724.5) Assessment: New exam unremarkable, likely lumbar strain. discussed stretches and providing lumbar support with her shoes.  Her updated medication list for this  problem includes:    Aspirin 81 Mg Tabs (Aspirin) ..... One daily    Naprosyn 500 Mg Tabs (Naproxen) .Marland Kitchen... Take one twice daily for knee x 7 days then as needed  Complete Medication List: 1)  Zoloft 50 Mg Tabs (Sertraline hcl) .Marland Kitchen.. 1 by mouth daily 2)  Multivitamins Tabs (Multiple vitamin) .... Once daily 3)  Omeprazole 20 Mg Cpdr (Omeprazole) .Marland Kitchen.. 1 by mouth 30 minutes before breakfast 4)  Calcium  .... Take one by mouth two times a day 5)  Aspirin 81 Mg Tabs (Aspirin) .... One  daily 6)  Vitamin B-12 500 Mcg Tabs (Cyanocobalamin) .... Take 1 tablet by mouth once a day 7)  Vitamin D 1000 Unit Tabs (Cholecalciferol) .... Take 1 tablet by mouth once a day 8)  Fish Oil Oil (Fish oil) .... Takes two daily 9)  Ambien 10 Mg Tabs (Zolpidem tartrate) .Marland Kitchen.. 1 tab by mouth at bedtime as needed insomnia 10)  Naprosyn 500 Mg Tabs (Naproxen) .... Take one twice daily for knee x 7 days then as needed 11)  Fluticasone Propionate 50 Mcg/act Susp (Fluticasone propionate) .... 2 sprays each nostril once daily 12)  Selenium Sulfide 2.5 % Lotn (Selenium sulfide) .... Use as directed on scalp 13)  Betamethasone Dipropionate Aug 0.05 % Oint (Betamethasone dipropionate aug) .... Use as directed on scalp 14)  Alprazolam 0.25 Mg Tabs (Alprazolam) .... 1/2 to 1 qdaily as needed anxiety 15)  Lipitor 20 Mg Tabs (Atorvastatin calcium) .... Take 1 tab by mouth daily 16)  Tessalon Perles 100 Mg Caps (Benzonatate) .Marland Kitchen.. 1 tab by mouth three times a day as needed cough 17)  Tussionex Pennkinetic Er 8-10 Mg/32ml Lqcr (Chlorpheniramine-hydrocodone) .... 5 ml by mouth at bedtime as needed cough   Orders Added: 1)  T-2 View CXR [71020TC] 2)  Est. Patient Level IV [16109]    Current Allergies (reviewed today): ! LATEX GLOVES (DISPOSABLE GLOVES) ! AUGMENTIN  Appended Document: COUGH,CHILLS,VERY TIRED/CLE  HUMANA     Allergies: 1)  ! Latex Gloves (Disposable Gloves) 2)  ! Augmentin   Complete Medication List: 1)   Zoloft 50 Mg Tabs (Sertraline hcl) .Marland Kitchen.. 1 by mouth daily 2)  Multivitamins Tabs (Multiple vitamin) .... Once daily 3)  Omeprazole 20 Mg Cpdr (Omeprazole) .Marland Kitchen.. 1 by mouth 30 minutes before breakfast 4)  Calcium  .... Take one by mouth two times a day 5)  Aspirin 81 Mg Tabs (Aspirin) .... One daily 6)  Vitamin B-12 500 Mcg Tabs (Cyanocobalamin) .... Take 1 tablet by mouth once a day 7)  Vitamin D 1000 Unit Tabs (Cholecalciferol) .... Take 1 tablet by mouth once a day 8)  Fish Oil Oil (Fish oil) .... Takes two daily 9)  Ambien 10 Mg Tabs (Zolpidem tartrate) .Marland Kitchen.. 1 tab by mouth at bedtime as needed insomnia 10)  Naprosyn 500 Mg Tabs (Naproxen) .... Take one twice daily for knee x 7 days then as needed 11)  Fluticasone Propionate 50 Mcg/act Susp (Fluticasone propionate) .... 2 sprays each nostril once daily 12)  Selenium Sulfide 2.5 % Lotn (Selenium sulfide) .... Use as directed on scalp 13)  Betamethasone Dipropionate Aug 0.05 % Oint (Betamethasone dipropionate aug) .... Use as directed on scalp 14)  Alprazolam 0.25 Mg Tabs (Alprazolam) .... 1/2 to 1 qdaily as needed anxiety 15)  Lipitor 20 Mg Tabs (Atorvastatin calcium) .... Take 1 tab by mouth daily 16)  Tessalon Perles 100 Mg Caps (Benzonatate) .Marland Kitchen.. 1 tab by mouth three times a day as needed cough 17)  Tussionex Pennkinetic Er 8-10 Mg/97ml Lqcr (Chlorpheniramine-hydrocodone) .... 5 ml by mouth at bedtime as needed cough  Other Orders: UA Dipstick w/o Micro (manual) (60454)   Orders Added: 1)  UA Dipstick w/o Micro (manual) [81002]     Laboratory Results   Urine Tests  Date/Time Received: December 18, 2010 2:12 PM   Routine Urinalysis   Color: yellow Appearance: Clear Glucose: negative   (Normal Range: Negative) Bilirubin: negative   (Normal Range: Negative) Ketone: negative   (Normal Range: Negative) Spec. Gravity: 1.015   (Normal Range: 1.003-1.035) Blood:  moderate   (Normal Range: Negative) pH: 5.0   (Normal Range:  5.0-8.0) Protein: negative   (Normal Range: Negative) Urobilinogen: 0.2   (Normal Range: 0-1) Nitrite: negative   (Normal Range: Negative) Leukocyte Esterace: negative   (Normal Range: Negative)       Appended Document: COUGH,CHILLS,VERY TIRED/CLE  HUMANA please let pt know UA neg  Appended Document: COUGH,CHILLS,VERY TIRED/CLE  HUMANA Patient advised as instructed via telephone.

## 2010-12-24 ENCOUNTER — Other Ambulatory Visit: Payer: Self-pay | Admitting: Family Medicine

## 2010-12-25 MED ORDER — ALPRAZOLAM 0.25 MG PO TABS: ORAL_TABLET | ORAL | Status: DC

## 2010-12-25 NOTE — Telephone Encounter (Signed)
Already approved

## 2011-01-14 ENCOUNTER — Other Ambulatory Visit: Payer: Self-pay | Admitting: Family Medicine

## 2011-01-16 NOTE — Telephone Encounter (Signed)
Ambien called to [facility] [address].

## 2011-01-28 ENCOUNTER — Other Ambulatory Visit: Payer: Self-pay | Admitting: Family Medicine

## 2011-01-30 ENCOUNTER — Other Ambulatory Visit: Payer: Self-pay | Admitting: *Deleted

## 2011-01-30 MED ORDER — SERTRALINE HCL 50 MG PO TABS: 50.0000 mg | ORAL_TABLET | Freq: Every day | ORAL | Status: DC

## 2011-01-30 NOTE — Telephone Encounter (Signed)
Patient request Rx refill on Zoloft, she will run out today.  Refill sent to [facility].

## 2011-02-02 ENCOUNTER — Ambulatory Visit: Payer: Self-pay

## 2011-02-11 ENCOUNTER — Other Ambulatory Visit: Payer: Self-pay | Admitting: Family Medicine

## 2011-02-12 ENCOUNTER — Other Ambulatory Visit: Payer: Self-pay | Admitting: Family Medicine

## 2011-02-12 ENCOUNTER — Telehealth: Payer: Self-pay | Admitting: *Deleted

## 2011-02-12 ENCOUNTER — Other Ambulatory Visit: Payer: Self-pay | Admitting: *Deleted

## 2011-02-12 MED ORDER — ZOLPIDEM TARTRATE 10 MG PO TABS: 10.0000 mg | ORAL_TABLET | Freq: Every day | ORAL | Status: DC

## 2011-02-12 NOTE — Telephone Encounter (Signed)
Opened in error

## 2011-02-12 NOTE — Telephone Encounter (Signed)
Rx called to [facility]. 

## 2011-02-17 NOTE — Assessment & Plan Note (Signed)
NAME:  Megan Hobbs, Megan Hobbs             ACCOUNT NO.:  [id number]   MEDICAL RECORD NO.:  [id number]          PATIENT TYPE:  POB   LOCATION:  [facility] [place]         FACILITY:  [facility]   PHYSICIAN:  Allie Bossier, MD        DATE OF BIRTH:  Jun 08, 1941   DATE OF SERVICE:  07/21/2007                                  CLINIC NOTE   Patient comes out today for her yearly Pap and pelvic.  She is  complaining of a slight burning sensation at the opening of her vagina.  She has been widowed for the past 10 years and is not sexually active  since her husband passed away.  She did have menopause and was on  Prempro until approximately 6 or 7 years ago when she was taken off.  Since then, she has not had any hormone replacement.  Her last Pap smear  was approximately 2 years ago.  It was within normal limits.  She has  not had an abnormal Pap smear since the 80s and she has some difficulty  recalling the circumstances.  She denies any symptoms of a urinary tract  infection.  She denies any breast complaints.  She has not had a  mammogram in 3 to 4 years.  She does have elevated cholesterol.  For  that, she is taking Lipitor.  She is seen at [facility].  She is  complaining today of acid reflux in which she is taking over-the-counter  Prilosec for quite some time.  She has not had a followup on her  colonoscopy in the past 15 to 20 years.  She is currently taking Zoloft  for depression and is happy with the dose.  She uses Ambien as needed  for sleep.   VITAL SIGNS:  Blood pressure is 140/89, pulse is 70, weight is 114, and  height is 4 feet 11 inches.   REVIEW OF SYSTEMS:  Patient denies excessive fatigue, weight gain, or  weight loss.  Vaginal complaints, see present illness.  Does complain of  depression.  No anxiety.   PHYSICAL EXAMINATION:  Well-developed, well-nourished 70 year old  Hispanic female in no acute distress.  HEENT:  Head is atraumatic and normocephalic.  NECK:  Her  thyroid is without masses or enlargement.  CARDIAC:  Regular rate and rhythm.  LUNGS:  Clear bilaterally without rales or rhonchi.  ABDOMEN:  Soft and nontender with positive bowel sounds and no masses.  BREASTS:  Asymmetrical.  There is no axilla lymphadenopathy.  There is  no mass appreciated.  There is no nipple discharge.  GENITALIA:  Externally, there were no lesions or discharges.  The labia  are thin and dry.  Vaginal walls are thin and dry.  She has decreased  secretions.  Cervix is thin and the cervical os is closed.  Bimanual  exam, there is no cervical motion tenderness.  There are no adnexal  masses.  There is no uterine tenderness.  EXTREMITIES:  Extremities are warm and dry with positive pulses.   ASSESSMENT AND PLAN:  1. Yearly Pap and pelvic.  She will be called if her Pap smear is      abnormal.  She was  referred today for her yearly mammogram.  She      will be referred to GI at [facility] for a colonoscopy, as well as      evaluation of her acid      reflux.  2. Atrophic vaginitis.  She will follow up in 1 month for reevaluation      of her atrophic vaginitis.      Remonia Richter, NP    ______________________________  Allie Bossier, MD    LR/MEDQ  D:  07/21/2007  T:  07/22/2007  Job:  [id number]

## 2011-02-17 NOTE — Telephone Encounter (Signed)
Rx called to [facility]. 

## 2011-02-20 NOTE — Assessment & Plan Note (Signed)
[facility]                                 ON-CALL NOTE   NAME:Tornow, TYREISHA                      MRN:          [id number]  DATE:01/24/2007                            DOB:          1940/11/25    PHONE NUMBER:  [phone]   Patient of Dr. Hetty Ely.   Phone call came at 1717 on April 21.  By mistake, Ms. Zeis took a sip  of dilute bleach which she had ready for cleaning; she thought it was  her drink and made a mistake.  As soon as she started tasting it going  down, she spit it right out.  She does not think she swallowed any or  any significant amount, but possibly she did.  She immediately tried to  induce vomiting by sticking her finger down her throat, which I told her  was not the right thing to do; I am not sure how successful she was with  that.  Currently, she is having no swallowing problems or pain in her  mouth.  She called for advice.   PLAN:  I told her that bleach can cause very severe burns in the mouth  and food pipe and that if she has any symptoms such as painful  swallowing or difficulty swallowing, then she needs to seek immediate  medical attention.  Given the fact that bleach has such as distinctive  taste, I doubt she got any down or if she did, it was very, very little,  so the likelihood of any significant damage is extremely small.  However, I did instruct her to have someone bring her to the emergency  room if she has any mouth or swallowing symptoms.     Karie Schwalbe, MD  Electronically Signed    RIL/MedQ  DD: 01/24/2007  DT: 01/25/2007  Job #: [id number]   cc:   Arta Silence, MD

## 2011-03-09 ENCOUNTER — Other Ambulatory Visit (HOSPITAL_COMMUNITY)
Admission: RE | Admit: 2011-03-09 | Discharge: 2011-03-09 | Disposition: A | Discharge status: Home / Self Care | Payer: Medicare PPO | Source: Ambulatory Visit | Attending: Family Medicine | Admitting: Family Medicine

## 2011-03-09 ENCOUNTER — Encounter: Payer: Self-pay | Admitting: Family Medicine

## 2011-03-09 ENCOUNTER — Ambulatory Visit (INDEPENDENT_AMBULATORY_CARE_PROVIDER_SITE_OTHER): Payer: Medicare PPO | Admitting: Family Medicine

## 2011-03-09 VITALS — BP 130/90 | HR 69 | Temp 98.3°F | Ht <= 58 in | Wt 110.1 lb

## 2011-03-09 DIAGNOSIS — Z23 Encounter for immunization: Secondary | ICD-10-CM

## 2011-03-09 DIAGNOSIS — Z124 Encounter for screening for malignant neoplasm of cervix: Secondary | ICD-10-CM | POA: Insufficient documentation

## 2011-03-09 DIAGNOSIS — Z0000 Encounter for general adult medical examination without abnormal findings: Secondary | ICD-10-CM | POA: Insufficient documentation

## 2011-03-09 DIAGNOSIS — Z1231 Encounter for screening mammogram for malignant neoplasm of breast: Secondary | ICD-10-CM

## 2011-03-09 DIAGNOSIS — E785 Hyperlipidemia, unspecified: Secondary | ICD-10-CM

## 2011-03-09 DIAGNOSIS — Z1159 Encounter for screening for other viral diseases: Secondary | ICD-10-CM | POA: Insufficient documentation

## 2011-03-09 LAB — HEPATIC FUNCTION PANEL
AST: 27 U/L (ref 0–37)
Albumin: 4.2 g/dL (ref 3.5–5.2)
Total Bilirubin: 1 mg/dL (ref 0.3–1.2)

## 2011-03-09 LAB — BASIC METABOLIC PANEL
CO2: 30 mEq/L (ref 19–32)
Calcium: 9.4 mg/dL (ref 8.4–10.5)
GFR: 118.9 mL/min (ref >60.00)
Potassium: 4.2 mEq/L (ref 3.5–5.1)
Sodium: 138 mEq/L (ref 135–145)

## 2011-03-09 LAB — LIPID PANEL
Cholesterol: 175 mg/dL (ref 0–200)
Total CHOL/HDL Ratio: 2
Triglycerides: 113 mg/dL (ref 0.0–149.0)

## 2011-03-09 MED ORDER — ZOLPIDEM TARTRATE 10 MG PO TABS: 10.0000 mg | ORAL_TABLET | Freq: Every day | ORAL | Status: DC

## 2011-03-09 MED ORDER — FLUTICASONE PROPIONATE 50 MCG/ACT NA SUSP: 2.0000 | Freq: Every day | NASAL | Status: DC

## 2011-03-09 NOTE — Progress Notes (Signed)
70 you here for: I have personally reviewed the Medicare Annual Wellness questionnaire and have noted 1. The patient's medical and social history 2. Their use of alcohol, tobacco or illicit drugs 3. Their current medications and supplements 4. The patient's functional ability including ADL's, fall risks, home safety risks and hearing or visual             Impairment- independent for all ADLs, AIDLs. 5. Diet and physical activities- her job requires heavy lifting, mod physical activity. 6. Evidence for depression or mood disorders- mood is stable.   HLD- well controlled on Lipitor 20 mg daily. Lab Results  Component Value Date   CHOL 235* 09/22/2010   CHOL 176 01/06/2010   CHOL 154 07/10/2009   Lab Results  Component Value Date   HDL 66.30 09/22/2010   HDL 74.30 01/06/2010   HDL 67.10 07/10/2009   Lab Results  Component Value Date   LDLCALC 75 01/06/2010   LDLCALC 71 07/10/2009   LDLCALC 82 10/26/2007   Lab Results  Component Value Date   TRIG 135.0 09/22/2010   TRIG 133.0 01/06/2010   TRIG 82.0 07/10/2009   Lab Results  Component Value Date   ALT 18 09/22/2010   AST 22 09/22/2010   ALKPHOS 54 09/22/2010   BILITOT 0.9 09/22/2010    Anxiety and depression- currently controlled with Zoloft 50 mg daily and as needed Alprazolam. Says she does better now because she is working full time, less time for her to get anxious about things. No SI or HI.  Well woman-  Due for Zostavax, colonoscopy, pap smear, since it has been over 5 years.    The PMH, PSH, Social History, Family History, Medications, and allergies have been reviewed in [facility], and have been updated if relevant.   Review of Systems       See HPI General:  Denies chills, fatigue, and fever. Eyes:  Denies blurring. ENT:  Denies decreased hearing and difficulty swallowing. CV:  Denies chest pain or discomfort. Resp:  Denies shortness of breath. GI:  Denies abdominal pain and bloody stools. Psych:  Denies anxiety and  depression.  Physical Exam BP 130/90  Pulse 69  Temp(Src) 98.3 F (36.8 C) (Oral)  Ht 4' 8.75" (1.441 m)  Wt 110 lb 1.9 oz (49.95 kg)  BMI 24.04 kg/m2  General:  Well-developed,well-nourished,in no acute distress; alert,appropriate and cooperative throughout examination Head:  normocephalic and atraumatic.   Eyes:  vision grossly intact, pupils equal, pupils round, and pupils reactive to light.   Ears:  R ear normal and L ear normal.   Nose:  no external deformity.   Mouth:  good dentition.   Neck:  No deformities, masses, or tenderness noted. Breasts:  No mass, nodules, thickening, tenderness, bulging, retraction, inflamation, nipple discharge or skin changes noted.   Lungs:  Normal respiratory effort, chest expands symmetrically. Lungs are clear to auscultation, no crackles or wheezes. Heart:  Normal rate and regular rhythm. S1 and S2 normal without gallop, murmur, click, rub or other extra sounds. Abdomen:  Bowel sounds positive,abdomen soft and non-tender without masses, organomegaly or hernias noted. Rectal:  no external abnormalities.   Genitalia:  Pelvic Exam:        External: normal female genitalia without lesions or masses        Vagina: normal without lesions or masses        Cervix: normal without lesions or masses        Adnexa: normal bimanual exam without masses or  fullness        Uterus: normal by palpation        Pap smear: performed Msk:  No deformity or scoliosis noted of thoracic or lumbar spine.   Extremities:  No clubbing, cyanosis, edema, or deformity noted with normal full range of motion of all joints.   Neurologic:  alert & oriented X3 and gait normal.   Skin:  Intact without suspicious lesions or rashes Cervical Nodes:  No lymphadenopathy noted Axillary Nodes:  No palpable lymphadenopathy Psych:  Cognition and judgment appear intact. Alert and cooperative with normal attention span and concentration. No apparent delusions, illusions, hallucinations

## 2011-03-09 NOTE — Patient Instructions (Signed)
Please stop by to see Megan Hobbs on your way out. 

## 2011-03-09 NOTE — Assessment & Plan Note (Signed)
The patients weight, height, BMI and visual acuity have been recorded in the chart I have made referrals, counseling and provided education to the patient based review of the above and I have provided the pt with a written personalized care plan for preventive services. Zostavax given today.   Screening pap today, will not repeat if normal. Orders Placed This Encounter  Procedures  . MM Digital Screening  . Lipid panel  . Hepatic function panel  . Basic Metabolic Panel (BMET)  . Ambulatory referral to Gastroenterology

## 2011-03-09 NOTE — Progress Notes (Signed)
Addended by: Gilmer Mor on: 03/09/2011 10:16 AM   Modules accepted: Orders

## 2011-03-13 ENCOUNTER — Encounter: Payer: Self-pay | Admitting: *Deleted

## 2011-04-06 ENCOUNTER — Other Ambulatory Visit: Payer: Self-pay | Admitting: *Deleted

## 2011-04-06 MED ORDER — ZOLPIDEM TARTRATE 10 MG PO TABS: 10.0000 mg | ORAL_TABLET | Freq: Every day | ORAL | Status: DC

## 2011-04-07 ENCOUNTER — Other Ambulatory Visit: Payer: Self-pay | Admitting: Family Medicine

## 2011-04-07 NOTE — Telephone Encounter (Signed)
Rx called to pharmacy

## 2011-04-09 NOTE — Telephone Encounter (Signed)
Rx called to [facility]. 

## 2011-04-13 ENCOUNTER — Encounter: Payer: Self-pay | Admitting: Gastroenterology

## 2011-04-16 ENCOUNTER — Other Ambulatory Visit: Payer: Self-pay | Admitting: Family Medicine

## 2011-04-20 ENCOUNTER — Other Ambulatory Visit: Payer: Medicare PPO | Admitting: Gastroenterology

## 2011-05-22 ENCOUNTER — Ambulatory Visit
Admission: RE | Admit: 2011-05-22 | Discharge: 2011-05-22 | Disposition: A | Discharge status: Home / Self Care | Payer: Medicare PPO | Source: Ambulatory Visit | Attending: Family Medicine | Admitting: Family Medicine

## 2011-05-22 ENCOUNTER — Ambulatory Visit (AMBULATORY_SURGERY_CENTER): Payer: Medicare PPO | Admitting: *Deleted

## 2011-05-22 ENCOUNTER — Encounter: Payer: Self-pay | Admitting: Gastroenterology

## 2011-05-22 VITALS — Ht <= 58 in | Wt 113.8 lb

## 2011-05-22 DIAGNOSIS — Z1231 Encounter for screening mammogram for malignant neoplasm of breast: Secondary | ICD-10-CM

## 2011-05-22 DIAGNOSIS — Z1211 Encounter for screening for malignant neoplasm of colon: Secondary | ICD-10-CM

## 2011-05-22 MED ORDER — PEG-KCL-NACL-NASULF-NA ASC-C 100 G PO SOLR: ORAL | Status: DC

## 2011-05-25 ENCOUNTER — Encounter: Payer: Self-pay | Admitting: *Deleted

## 2011-05-29 ENCOUNTER — Other Ambulatory Visit: Payer: Self-pay | Admitting: Family Medicine

## 2011-05-30 ENCOUNTER — Other Ambulatory Visit: Payer: Self-pay | Admitting: Family Medicine

## 2011-06-01 NOTE — Telephone Encounter (Signed)
Rx called to [facility].

## 2011-06-02 ENCOUNTER — Encounter: Payer: Self-pay | Admitting: Gastroenterology

## 2011-06-02 ENCOUNTER — Ambulatory Visit (AMBULATORY_SURGERY_CENTER): Payer: Medicare PPO | Admitting: Gastroenterology

## 2011-06-02 DIAGNOSIS — Z1211 Encounter for screening for malignant neoplasm of colon: Secondary | ICD-10-CM

## 2011-06-02 DIAGNOSIS — D126 Benign neoplasm of colon, unspecified: Secondary | ICD-10-CM

## 2011-06-02 MED ORDER — SODIUM CHLORIDE 0.9 % IV SOLN: 500.0000 mL | INTRAVENOUS | Status: DC

## 2011-06-02 NOTE — Progress Notes (Signed)
Pt tolerated the colon procedure very well. Maw  Pt moved her arm and IV not dripping.  Untapped IV and cath was crimped.  Fixed and retaped IV dripping well.  Pt's B/P 84/53 retook 80/52.  IV bag left to wide open drip.  Pt warm, dry, pink and responsive.  maw

## 2011-06-02 NOTE — Patient Instructions (Signed)
Discharge instructions given with verbal understanding. Handouts on polyps ,diverticulosis and a high fiber dirt given. Resume previous medications.

## 2011-06-03 ENCOUNTER — Telehealth: Payer: Self-pay

## 2011-06-03 NOTE — Telephone Encounter (Signed)

## 2011-06-10 ENCOUNTER — Encounter: Payer: Self-pay | Admitting: Gastroenterology

## 2011-06-12 ENCOUNTER — Other Ambulatory Visit: Payer: Self-pay | Admitting: Family Medicine

## 2011-06-13 NOTE — Telephone Encounter (Signed)
Please call in

## 2011-06-15 NOTE — Telephone Encounter (Signed)
Medication phoned to pharmacy.  

## 2011-06-16 ENCOUNTER — Other Ambulatory Visit: Payer: Self-pay | Admitting: Family Medicine

## 2011-06-16 NOTE — Telephone Encounter (Signed)
Rx called to [facility]. 

## 2011-07-24 ENCOUNTER — Other Ambulatory Visit: Payer: Self-pay | Admitting: Family Medicine

## 2011-07-28 ENCOUNTER — Other Ambulatory Visit: Payer: Self-pay | Admitting: *Deleted

## 2011-07-28 NOTE — Telephone Encounter (Signed)
This Rx was authorized and refill by Dr. Dayton Martes on 07/24/2011 but was never called into pharmacy.  Called refill to pharmacy, patient notified via telephone.

## 2011-07-28 NOTE — Telephone Encounter (Signed)
Rx called to [facility].  Patient notified via telephone.

## 2011-08-19 ENCOUNTER — Ambulatory Visit: Payer: Medicare PPO

## 2011-08-21 ENCOUNTER — Other Ambulatory Visit: Payer: Self-pay | Admitting: Family Medicine

## 2011-08-21 NOTE — Telephone Encounter (Signed)
Rx called to [facility]. 

## 2011-08-21 NOTE — Telephone Encounter (Signed)
Received Rx for Ambien electronically, Rx denied I already called in Ambien and left on voicemail at [facility] earlier.

## 2011-09-01 ENCOUNTER — Encounter: Payer: Self-pay | Admitting: Family Medicine

## 2011-09-01 ENCOUNTER — Ambulatory Visit (INDEPENDENT_AMBULATORY_CARE_PROVIDER_SITE_OTHER): Payer: Medicare PPO | Admitting: Family Medicine

## 2011-09-01 ENCOUNTER — Other Ambulatory Visit: Payer: Self-pay | Admitting: Family Medicine

## 2011-09-01 DIAGNOSIS — J0100 Acute maxillary sinusitis, unspecified: Secondary | ICD-10-CM

## 2011-09-01 MED ORDER — AZITHROMYCIN 250 MG PO TABS: ORAL_TABLET | ORAL | Status: AC

## 2011-09-01 NOTE — Telephone Encounter (Signed)
Rx filled electronically by Dr. Dayton Martes.

## 2011-09-01 NOTE — Patient Instructions (Signed)
SINUSITIS Sinuses are cavities in facial skeleton that drain to nose. Impaired drainage and obstruction of sinus passages main cause.  Treatment: 1. Take all Antibiotics 3. Steam inhalation 4. Humidifier in room 5. Frequent nasal saline irrigation 6. Moist heat compresses to face 7. Tylenol or Ibuprofen for pain and fever, follow directions on bottle.  

## 2011-09-01 NOTE — Progress Notes (Signed)
  Patient Name: Megan Hobbs Date of Birth: 09-19-1941 Age: 70 y.o. Medical Record Number: [id number] Gender: female  History of Present Illness:  Megan Hobbs is a 70 y.o. very pleasant female patient who presents with the following:  Lost voice, very bad cough. Nothing is coming out of her chest. Having a bad headdache. A bad buzzing sensation in her ears. Has been having some sweats, hot and cold.  Really sweaty. Not having muscle aches and joint aches. Like some burning in her throat. She generally feels poorly. She is having a very bad cough. At this point nothing is productive. No nausea, vomiting, or diarrhea. Sinuses are significantly tender  Past Medical History, Surgical History, Social History, Family History, and Problem List have been reviewed in EHR and updated if relevant.  Review of Systems: ROS: GEN: Acute illness details above GI: Tolerating PO intake GU: maintaining adequate hydration and urination Pulm: No SOB Interactive and getting along well at home.  Otherwise, ROS is as per the HPI.   Physical Examination: Filed Vitals:   09/01/11 1419  BP: 120/78  Pulse: 87  Temp: 98.4 F (36.9 C)  TempSrc: Oral  Height: 4\' 9"  (1.448 m)  Weight: 110 lb 6.4 oz (50.077 kg)  SpO2: 97%     GEN: A and O x 3. WDWN. NAD.    ENT: Nose clear, ext NML.  No LAD.  No JVD.  TM's clear. Oropharynx clear.  Max sinuses TTP PULM: Normal WOB, no distress. No crackles, wheezes, rhonchi. CV: RRR, no M/G/R, No rubs, No JVD.   EXT: warm and well-perfused, No c/c/e. PSYCH: Pleasant and conversant.   Assessment and Plan: Acute sinusitis: ABX as below.  Refer to the patient instructions sections for details of plan shared with patient.  Reviewed symptomatic care as well as ABX in this case.

## 2011-09-22 ENCOUNTER — Other Ambulatory Visit: Payer: Self-pay | Admitting: Family Medicine

## 2011-09-22 NOTE — Telephone Encounter (Signed)
Rx called to [facility]. 

## 2011-10-20 ENCOUNTER — Other Ambulatory Visit: Payer: Self-pay | Admitting: Family Medicine

## 2011-10-20 NOTE — Telephone Encounter (Signed)
Rxs called to [facility].  

## 2011-11-17 ENCOUNTER — Other Ambulatory Visit: Payer: Self-pay | Admitting: Family Medicine

## 2011-11-17 NOTE — Telephone Encounter (Signed)
Rx called to [facility]. 

## 2011-11-27 ENCOUNTER — Other Ambulatory Visit: Payer: Self-pay | Admitting: Family Medicine

## 2011-12-24 ENCOUNTER — Encounter: Payer: Self-pay | Admitting: Family Medicine

## 2011-12-24 ENCOUNTER — Ambulatory Visit (INDEPENDENT_AMBULATORY_CARE_PROVIDER_SITE_OTHER): Payer: Medicare Other | Admitting: Family Medicine

## 2011-12-24 VITALS — BP 122/84 | HR 72 | Temp 97.8°F | Wt 106.0 lb

## 2011-12-24 DIAGNOSIS — J069 Acute upper respiratory infection, unspecified: Secondary | ICD-10-CM

## 2011-12-24 MED ORDER — DOXYCYCLINE HYCLATE 100 MG PO CAPS: 100.0000 mg | ORAL_CAPSULE | Freq: Two times a day (BID) | ORAL | Status: AC

## 2011-12-24 NOTE — Progress Notes (Signed)
SUBJECTIVE:  Megan Hobbs is a 71 y.o. female who complains of coryza, congestion, sore throat, dry cough, myalgias, headache and bilateral sinus pain for 4 days. She denies a history of chest pain, fevers, shortness of breath, sweats and vomiting and denies a history of asthma. Patient denies smoke cigarettes.   Patient Active Problem List  Diagnoses  . HYPERLIPIDEMIA  . CARPAL TUNNEL SYNDROME  . ALLERGIC RHINITIS  . G E R D  . COLITIS  . CERVICAL POLYP  . VAGINISMUS  . DISORDER, MENOPAUSAL NOS  . MUSCLE SPASM, TRAPEZIUS MUSCLE, RIGHT  . OSTEOPENIA  . Other Malaise and Fatigue  . LOCALIZED SUPERFICIAL SWELLING MASS OR LUMP  . ANKLE EDEMA  . CHEST PAIN  . OTHER DRUG ALLERGY  . CERUMEN IMPACTION, RECURRENT  . INTERNAL DERANGEMENT, LEFT KNEE  . POPLITEAL CYST, LEFT  . OTHER ACUTE SINUSITIS  . BACK PAIN, ACUTE  . COUGH  . Routine general medical examination at a health care facility   Past Medical History  Diagnosis Date  . Depression   . Hyperlipidemia   . Osteopenia   . Allergy   . Anxiety    Past Surgical History  Procedure Date  . Appendectomy 1963   History  Substance Use Topics  . Smoking status: Never Smoker   . Smokeless tobacco: Never Used   Comment: teens  . Alcohol Use: No   Family History  Problem Relation Age of Onset  . Stroke Father 13  . Pneumonia Mother   . Stroke Mother     RAD  . Hypertension Brother     multiple brothers  . Hypertension Sister     multiple sisters  . Diabetes Daughter   . Diabetes Sister   . Cancer Sister     ? hysterectomy  . Cancer Sister     leukemia                                 Allergies  Allergen Reactions  . LKG:[id number]+[id number]+ZDGLOVFIEP Acid+Aspartame     REACTION: rash all over  . Latex    Current Outpatient Prescriptions on File Prior to Visit  Medication Sig Dispense Refill  . ALPRAZolam (XANAX) 0.25 MG tablet TAKE ONE-HALF TO ONE TABLET BY MOUTH EVERY DAY AS NEEDED  30 tablet  0  .  aspirin 81 MG tablet Take 81 mg by mouth daily.        Marland Kitchen atorvastatin (LIPITOR) 20 MG tablet TAKE ONE TABLET BY MOUTH EVERY DAY  30 tablet  6  . B Complex-C (SUPER B COMPLEX PO) Take 1 tablet by mouth daily.        Marland Kitchen BIOTIN 5000 PO Take 10,000 mEq by mouth daily.        . Calcium Carbonate-Vitamin D (CALCIUM + D PO) Take 1 tablet by mouth daily.        . cholecalciferol (VITAMIN D) 1000 UNITS tablet Take 1,000 Units by mouth daily.        . Cholecalciferol (VITAMIN D-3) 5000 UNITS TABS Take 1 tablet by mouth daily.        . Cyanocobalamin (B-12) 500 MCG TABS Take 1 tablet by mouth daily. 1000 UNITS      . Fish Oil OIL Take 1 tablet by mouth 2 (two) times daily.        . folic acid (FOLVITE) 400 MCG tablet Take 400 mcg by mouth daily.        Marland Kitchen  Loratadine-Pseudoephedrine (CLARITIN-D 12 HOUR PO) Take 1 tablet by mouth daily.        . Multiple Minerals-Vitamins (CALCIUM CITRATE +) TABS Take 1 tablet by mouth 2 (two) times daily.        . Multiple Vitamin (MULTIVITAMIN) tablet Take 1 tablet by mouth daily.        . Omeprazole 20 MG TBEC Take 1 tablet by mouth daily before breakfast. PRILOSEC      . sertraline (ZOLOFT) 50 MG tablet TAKE ONE TABLET BY MOUTH EVERY DAY  30 tablet  6  . zolpidem (AMBIEN) 10 MG tablet TAKE ONE TABLET BY MOUTH AT BEDTIME  30 tablet  1  . fluticasone (FLONASE) 50 MCG/ACT nasal spray Place 2 sprays into the nose daily.  16 g  3   The PMH, PSH, Social History, Family History, Medications, and allergies have been reviewed in [facility], and have been updated if relevant.  OBJECTIVE: BP 122/84  Pulse 72  Temp(Src) 97.8 F (36.6 C) (Oral)  Wt 106 lb (48.081 kg)  She appears well, vital signs are as noted. Ears normal.  Throat and pharynx normal.  Neck supple. No adenopathy in the neck. Nose is congested. Sinuses non tender. The chest is clear, without wheezes or rales.  ASSESSMENT:  viral upper respiratory illness  PLAN: Symptomatic therapy suggested: push fluids, rest and  return office visit prn if symptoms persist or worsen. Lack of antibiotic effectiveness discussed with her. Call or return to clinic prn if these symptoms worsen or fail to improve as anticipated.

## 2011-12-24 NOTE — Patient Instructions (Signed)
Nice to see you. This is likely a virus. Drink lots of fluids.  Treat sympotmatically with Mucinex, nasal saline irrigation, and Tylenol/Ibuprofen. Start taking antibiotic if not better in 5 days.

## 2011-12-28 ENCOUNTER — Telehealth: Payer: Self-pay | Admitting: Family Medicine

## 2011-12-28 NOTE — Telephone Encounter (Signed)
Agreed with above advise.

## 2011-12-28 NOTE — Telephone Encounter (Signed)
[facility] Triage Call Report Triage Record Num: [id number] Operator: Tomasita Crumble Patient Name: Megan Hobbs Call Date & Time: 12/26/2011 8:47:57PM Patient Phone: [phone] PCP: Patient Gender: Female PCP Fax : Patient DOB: 11-24-1940 Practice Name: [facility] Reason for Call: Caller: Shayonna/Patient; PCP: Ruthe Mannan Nestor Ramp); CB#: [phone]; Call regarding Was advised to get Rx if not better in 5 days for a sinus infection ... it has only been 3 days and she can not take it anymore; Seen 3/21 in office and she does not feel any better. Febrile/subjective - chills, sinus pain. Bloody nasal secretions reported. She was not put on any medications. Headache rated at 8 of 10. Advised see provider in 4 hours per nursing judgment and URI protocol. Caller states, "I don't want to go there." She has written Rx. for Doxycycline 100 mg sig BID. Advised caller to get Rx filled since her sx seem worse than when seen. Home care for the interim and parameters for callback given. Protocol(s) Used: Upper Respiratory Infection (URI) Recommended Outcome per Protocol: Call Provider Immediately Reason for Outcome: Any temperature elevation in an immunocompromised individual OR frail elderly Care Advice: ~ Another adult should drive. Drink more fluids -- water, low-sugar juices, tea and warm soup, especially chicken broth, are options. Avoid caffeinated or alcoholic beverages because they can increase the chance of dehydration. ~ Analgesic/Antipyretic Advice - Acetaminophen: Consider acetaminophen as directed on label or by pharmacist/provider for pain or fever. PRECAUTIONS: - Use only if there is no history of liver disease, alcoholism, or intake of three or more alcohol drinks per day. - If approved by provider when breastfeeding. - Do not exceed recommended dose or frequency. ~ 12/26/2011 9:04:20PM Page 1 of 1 [id number]_[email or web address]

## 2012-01-06 ENCOUNTER — Other Ambulatory Visit: Payer: Self-pay | Admitting: Family Medicine

## 2012-01-06 ENCOUNTER — Ambulatory Visit (INDEPENDENT_AMBULATORY_CARE_PROVIDER_SITE_OTHER): Payer: Medicare Other | Admitting: Family Medicine

## 2012-01-06 ENCOUNTER — Other Ambulatory Visit: Payer: Self-pay | Admitting: *Deleted

## 2012-01-06 ENCOUNTER — Encounter: Payer: Self-pay | Admitting: Family Medicine

## 2012-01-06 VITALS — BP 118/80 | HR 68 | Temp 97.9°F | Ht <= 58 in | Wt 108.2 lb

## 2012-01-06 DIAGNOSIS — J0100 Acute maxillary sinusitis, unspecified: Secondary | ICD-10-CM

## 2012-01-06 DIAGNOSIS — M549 Dorsalgia, unspecified: Secondary | ICD-10-CM

## 2012-01-06 MED ORDER — LEVOFLOXACIN 500 MG PO TABS: 500.0000 mg | ORAL_TABLET | Freq: Every day | ORAL | Status: DC

## 2012-01-06 MED ORDER — SERTRALINE HCL 50 MG PO TABS: ORAL_TABLET | ORAL | Status: DC

## 2012-01-06 MED ORDER — LEVOFLOXACIN 500 MG PO TABS: 500.0000 mg | ORAL_TABLET | Freq: Every day | ORAL | Status: AC

## 2012-01-06 NOTE — Patient Instructions (Signed)
Drink lots of fluids claritin D is ok for congestion  Use saline nasal spray as often as you want to help congestion  Take levaquin for the sinus infection as directed  Tylenol is ok for pain  Try to get some extra rest  Update if not starting to improve in a week or if worsening  (sinuses or back pain)

## 2012-01-06 NOTE — Progress Notes (Signed)
Subjective:    Patient ID: Megan Hobbs, female    DOB: August 24, 1941, 71 y.o.   MRN: [id number]  HPI Was here in late march for uri  Still very congested and now more prod cough --- phlegm is tan color and sometimes blood from her nose Hot and cold - ? If any fever  No body aches but is very tired  A lot of pressure in her face  No appetite   Some soreness in R side of mid back - ribs are quite sore -- worse to cough or reach over  It hurts to cough - low in the back    No urine symptoms at all  No blood in urine   Patient Active Problem List  Diagnoses  . HYPERLIPIDEMIA  . CARPAL TUNNEL SYNDROME  . ALLERGIC RHINITIS  . G E R D  . COLITIS  . CERVICAL POLYP  . VAGINISMUS  . DISORDER, MENOPAUSAL NOS  . MUSCLE SPASM, TRAPEZIUS MUSCLE, RIGHT  . OSTEOPENIA  . Other Malaise and Fatigue  . LOCALIZED SUPERFICIAL SWELLING MASS OR LUMP  . ANKLE EDEMA  . CHEST PAIN  . OTHER DRUG ALLERGY  . CERUMEN IMPACTION, RECURRENT  . INTERNAL DERANGEMENT, LEFT KNEE  . POPLITEAL CYST, LEFT  . OTHER ACUTE SINUSITIS  . BACK PAIN, ACUTE  . COUGH  . Routine general medical examination at a health care facility  . Acute maxillary sinusitis   Past Medical History  Diagnosis Date  . Depression   . Hyperlipidemia   . Osteopenia   . Allergy   . Anxiety    Past Surgical History  Procedure Date  . Appendectomy 1963   History  Substance Use Topics  . Smoking status: Never Smoker   . Smokeless tobacco: Never Used   Comment: teens  . Alcohol Use: No   Family History  Problem Relation Age of Onset  . Stroke Father 76  . Pneumonia Mother   . Stroke Mother     RAD  . Hypertension Brother     multiple brothers  . Hypertension Sister     multiple sisters  . Diabetes Daughter   . Diabetes Sister   . Cancer Sister     ? hysterectomy  . Cancer Sister     leukemia                                 Allergies  Allergen Reactions  . WUJ:[id number]+[id number]+QIONGEXBMW Acid+Aspartame       REACTION: rash all over  . Latex    Current Outpatient Prescriptions on File Prior to Visit  Medication Sig Dispense Refill  . ALPRAZolam (XANAX) 0.25 MG tablet TAKE ONE-HALF TO ONE TABLET BY MOUTH EVERY DAY AS NEEDED  30 tablet  0  . aspirin 81 MG tablet Take 81 mg by mouth daily.        Marland Kitchen atorvastatin (LIPITOR) 20 MG tablet TAKE ONE TABLET BY MOUTH EVERY DAY  30 tablet  6  . B Complex-C (SUPER B COMPLEX PO) Take 1 tablet by mouth daily.        Marland Kitchen BIOTIN 5000 PO Take 10,000 mEq by mouth daily.        . Calcium Carbonate-Vitamin D (CALCIUM + D PO) Take 1 tablet by mouth daily.        . cholecalciferol (VITAMIN D) 1000 UNITS tablet Take 1,000 Units by mouth daily.        Marland Kitchen  Cholecalciferol (VITAMIN D-3) 5000 UNITS TABS Take 1 tablet by mouth daily.        . Cyanocobalamin (B-12) 500 MCG TABS Take 1 tablet by mouth daily. 1000 UNITS      . Fish Oil OIL Take 1 tablet by mouth 2 (two) times daily.        . fluticasone (FLONASE) 50 MCG/ACT nasal spray Place 2 sprays into the nose daily.  16 g  3  . folic acid (FOLVITE) 400 MCG tablet Take 400 mcg by mouth daily.        . Loratadine-Pseudoephedrine (CLARITIN-D 12 HOUR PO) Take 1 tablet by mouth daily.        . Multiple Minerals-Vitamins (CALCIUM CITRATE +) TABS Take 1 tablet by mouth 2 (two) times daily.        . Multiple Vitamin (MULTIVITAMIN) tablet Take 1 tablet by mouth daily.        . Omeprazole 20 MG TBEC Take 1 tablet by mouth daily before breakfast. PRILOSEC      . oxymetazoline (AFRIN) 0.05 % nasal spray Place 2 sprays into the nose 2 (two) times daily.      Marland Kitchen zolpidem (AMBIEN) 10 MG tablet TAKE ONE TABLET BY MOUTH AT BEDTIME  30 tablet  0  . sertraline (ZOLOFT) 50 MG tablet TAKE ONE TABLET BY MOUTH EVERY DAY  30 tablet  6         Review of Systems Review of Systems  Constitutional: Negative for  appetite change,  and unexpected weight change. pos for fatigue  Eyes: Negative for pain and visual disturbance. no eye redness or  d/c ENT pos for congestion nasal with sinus pain and ear pain  Respiratory: Negative for cough and shortness of breath.   Cardiovascular: Negative for cp or palpitations   (pos for chest wall pain) Gastrointestinal: Negative for nausea, diarrhea and constipation.  Genitourinary: Negative for urgency and frequency.  Skin: Negative for pallor or rash   MSK pos for low back pain, no joint swelling  Neurological: Negative for weakness, light-headedness, numbness and headaches.  Hematological: Negative for adenopathy. Does not bruise/bleed easily.  Psychiatric/Behavioral: Negative for dysphoric mood. The patient is not nervous/anxious.          Objective:   Physical Exam  Constitutional: She appears well-developed and well-nourished. No distress.  HENT:  Head: Normocephalic and atraumatic.  Right Ear: External ear normal.  Left Ear: External ear normal.  Mouth/Throat: Oropharynx is clear and moist. No oropharyngeal exudate.       Nares are injected and congested  bilat maxillary sinus congestion Throat- post clear drip  Eyes: Conjunctivae and EOM are normal. Pupils are equal, round, and reactive to light. Right eye exhibits no discharge. Left eye exhibits no discharge.  Neck: Normal range of motion. Neck supple. No JVD present. Carotid bruit is not present. No thyromegaly present.  Cardiovascular: Normal rate, regular rhythm and normal heart sounds.   Pulmonary/Chest: Effort normal and breath sounds normal. No respiratory distress. She has no wheezes. She has no rales. She exhibits tenderness.       Mild L post rib tenderness without crepitus or skin change   Abdominal: Soft. Bowel sounds are normal. She exhibits no abdominal bruit.  Musculoskeletal: She exhibits tenderness. She exhibits no edema.       Mild R sided lumbar tenderness with nl rom (no bony tenderness)   Lymphadenopathy:    She has no cervical adenopathy.  Neurological: She is alert. She has normal reflexes. No  cranial  nerve deficit.  Skin: Skin is warm and dry. No rash noted. No erythema. No pallor.  Psychiatric: She has a normal mood and affect.          Assessment & Plan:

## 2012-01-06 NOTE — Telephone Encounter (Signed)
Patient advised that Rx was sent to pharmacy electronically.

## 2012-01-06 NOTE — Telephone Encounter (Signed)
Patient request Rx refill while in to see Dr. Milinda Antis, please advise.

## 2012-01-06 NOTE — Assessment & Plan Note (Signed)
Some R low back and lateral rib pain  I suspect this is muscle spasm from coughing  Recommend warm compress/tylenol prn  Will update if any urinary symptoms develop or if not imp

## 2012-01-06 NOTE — Assessment & Plan Note (Signed)
With facial pain and purulent nasal drainage and likely intermittent fever after uri Cover with levaquin (pt pref name brand)  Disc symptomatic care - see instructions on AVS  Update if not starting to improve in a week or if worsening

## 2012-01-06 NOTE — Telephone Encounter (Signed)
Medicine called to pharmacy. 

## 2012-02-03 ENCOUNTER — Other Ambulatory Visit: Payer: Self-pay | Admitting: Family Medicine

## 2012-02-03 NOTE — Telephone Encounter (Signed)
Medicine called to [facility]. 

## 2012-03-01 ENCOUNTER — Other Ambulatory Visit: Payer: Self-pay | Admitting: Family Medicine

## 2012-03-01 NOTE — Telephone Encounter (Signed)
Medicine called to pharmacy. 

## 2012-03-02 ENCOUNTER — Other Ambulatory Visit: Payer: Self-pay | Admitting: Family Medicine

## 2012-03-02 NOTE — Telephone Encounter (Signed)
Medicine called to pharmacy. 

## 2012-03-03 ENCOUNTER — Ambulatory Visit (INDEPENDENT_AMBULATORY_CARE_PROVIDER_SITE_OTHER): Payer: Medicare Other | Admitting: Family Medicine

## 2012-03-03 ENCOUNTER — Encounter: Payer: Self-pay | Admitting: Family Medicine

## 2012-03-03 VITALS — BP 130/84 | HR 64 | Temp 98.6°F | Wt 109.0 lb

## 2012-03-03 DIAGNOSIS — R319 Hematuria, unspecified: Secondary | ICD-10-CM

## 2012-03-03 DIAGNOSIS — R300 Dysuria: Secondary | ICD-10-CM

## 2012-03-03 DIAGNOSIS — M549 Dorsalgia, unspecified: Secondary | ICD-10-CM

## 2012-03-03 LAB — POCT URINALYSIS DIPSTICK
Leukocytes, UA: NEGATIVE
Nitrite, UA: NEGATIVE
pH, UA: 7.5

## 2012-03-03 MED ORDER — OMEPRAZOLE 40 MG PO CPDR: 40.0000 mg | DELAYED_RELEASE_CAPSULE | Freq: Every day | ORAL | Status: DC

## 2012-03-03 NOTE — Patient Instructions (Addendum)
Please stop by to see Megan Hobbs on your way out to set up your CT scan. We will call you as soon as we get the results.  If you do not have a kidney stone, I do you think you have back strain.  Take tylenol for baseline pain relief (1-2 extra strength tabs 3x/day) A prednisone dose pack is the best option for immediate relief and may be prescribed with transition to an anti-inflammatory like aleve or meloxicam (if you do not have stomach or kidney issues). Tramadol or vicodin as needed for severe pain (no driving on this medicine). Flexeril as needed for muscle spasms (no driving on this medicine). Stay as active as possible. Physical therapy has been shown to be helpful as well. Strengthening of low back muscles, abdominal musculature are key for long term pain relief. If not improving, will consider further imaging (MRI).

## 2012-03-03 NOTE — Progress Notes (Signed)
Subjective:    Patient ID: Megan Hobbs, female    DOB: 1941-05-19, 71 y.o.   MRN: [id number]  HPI  71 yo female here for back pain for past several days, getting progressively worse. Mainly left lower back pain, radiate to left flank. Is having some intermittent right lower back pain. Mild dysuria.  No increased frequency. No nausea, or vomiting.  No known injury.  Pain is worsened by bending and twisting motion.  No radiculopathy.  No LE weakness.  Patient Active Problem List  Diagnoses  . HYPERLIPIDEMIA  . CARPAL TUNNEL SYNDROME  . ALLERGIC RHINITIS  . G E R D  . COLITIS  . CERVICAL POLYP  . VAGINISMUS  . DISORDER, MENOPAUSAL NOS  . MUSCLE SPASM, TRAPEZIUS MUSCLE, RIGHT  . OSTEOPENIA  . Other Malaise and Fatigue  . LOCALIZED SUPERFICIAL SWELLING MASS OR LUMP  . ANKLE EDEMA  . CHEST PAIN  . OTHER DRUG ALLERGY  . CERUMEN IMPACTION, RECURRENT  . INTERNAL DERANGEMENT, LEFT KNEE  . POPLITEAL CYST, LEFT  . OTHER ACUTE SINUSITIS  . BACK PAIN, ACUTE  . COUGH  . Routine general medical examination at a health care facility  . Acute maxillary sinusitis  . Back pain  . Hematuria   Past Medical History  Diagnosis Date  . Depression   . Hyperlipidemia   . Osteopenia   . Allergy   . Anxiety    Past Surgical History  Procedure Date  . Appendectomy 1963   History  Substance Use Topics  . Smoking status: Never Smoker   . Smokeless tobacco: Never Used   Comment: teens  . Alcohol Use: No   Family History  Problem Relation Age of Onset  . Stroke Father 93  . Pneumonia Mother   . Stroke Mother     RAD  . Hypertension Brother     multiple brothers  . Hypertension Sister     multiple sisters  . Diabetes Daughter   . Diabetes Sister   . Cancer Sister     ? hysterectomy  . Cancer Sister     leukemia                                 Allergies  Allergen Reactions  . Amoxicillin-Pot Clavulanate     REACTION: rash all over  . Latex    Current  Outpatient Prescriptions on File Prior to Visit  Medication Sig Dispense Refill  . ALPRAZolam (XANAX) 0.25 MG tablet TAKE ONE-HALF TO ONE TABLET BY MOUTH EVERY DAY AS NEEDED  30 tablet  0  . aspirin 81 MG tablet Take 81 mg by mouth daily.        Marland Kitchen atorvastatin (LIPITOR) 20 MG tablet TAKE ONE TABLET BY MOUTH EVERY DAY  30 tablet  6  . B Complex-C (SUPER B COMPLEX PO) Take 1 tablet by mouth daily.        Marland Kitchen BIOTIN 5000 PO Take 10,000 mEq by mouth daily.        . Calcium Carbonate-Vitamin D (CALCIUM + D PO) Take 1 tablet by mouth daily.        . cholecalciferol (VITAMIN D) 1000 UNITS tablet Take 1,000 Units by mouth daily.        . Cholecalciferol (VITAMIN D-3) 5000 UNITS TABS Take 1 tablet by mouth daily.        . Cyanocobalamin (B-12) 500 MCG TABS Take 1 tablet by mouth daily. 1000  UNITS      . Fish Oil OIL Take 1 tablet by mouth 2 (two) times daily.        . fluticasone (FLONASE) 50 MCG/ACT nasal spray Place 2 sprays into the nose daily.  16 g  3  . folic acid (FOLVITE) 400 MCG tablet Take 400 mcg by mouth daily.        . Loratadine-Pseudoephedrine (CLARITIN-D 12 HOUR PO) Take 1 tablet by mouth daily.        . Multiple Minerals-Vitamins (CALCIUM CITRATE +) TABS Take 1 tablet by mouth 2 (two) times daily.        . Multiple Vitamin (MULTIVITAMIN) tablet Take 1 tablet by mouth daily.        Marland Kitchen oxymetazoline (AFRIN) 0.05 % nasal spray Place 2 sprays into the nose 2 (two) times daily.      . sertraline (ZOLOFT) 50 MG tablet TAKE ONE TABLET BY MOUTH EVERY DAY  30 tablet  6  . zolpidem (AMBIEN) 10 MG tablet TAKE ONE TABLET BY MOUTH AT BEDTIME  30 tablet  1   The PMH, PSH, Social History, Family History, Medications, and allergies have been reviewed in [facility], and have been updated if relevant.   Review of Systems See HPI  No gross hematuria    Objective:   Physical Exam BP 130/84  Pulse 64  Temp(Src) 98.6 F (37 C) (Oral)  Wt 109 lb (49.442 kg)  General:  Well-developed,well-nourished,in no  acute distress; alert,appropriate and cooperative throughout examination Head:  normocephalic and atraumatic.   Eyes:  vision grossly intact, pupils equal, pupils round, and pupils reactive to light.   Ears:  R ear normal and L ear normal.   Nose:  no external deformity.   Abdomen:  Bowel sounds positive,abdomen soft  Mild suprapubic tenderness, no rebound or guarding Msk:  No deformity or scoliosis noted of thoracic or lumbar spine.   No CVA tenderness or paraspinous tenderness Neg SLR and Neg fabers bilaterally Extremities:  No clubbing, cyanosis, edema, or deformity noted with normal full range of motion of all joints.   Neurologic:  alert & oriented X3 and gait normal.   Skin:  Intact without suspicious lesions or rashes Psych:  Cognition and judgment appear intact. Alert and cooperative with normal attention span and concentration. No apparent delusions, illusions, hallucinations     Assessment & Plan:   1. Dysuria  New. UA pos for mod blood only. No LE or nitrites. Unlikely UTI- send urine for cx. ? Nephrolithiasis- stat CT of abd/pelvis without contrast. POCT urinalysis dipstick, Urine culture, CT Abdomen Pelvis Wo Contrast  2. Hematuria  New- see above.  If CT neg, follow up in 2-4 weeks for repeat UA/micro.   3. Back pain  New- ?multifactorial- see above- rule out nephrolithiasis but also does have characteristics of lumbar strain. If CT neg, discussed supportive care for back pain.  See pt instructions for details.

## 2012-03-04 ENCOUNTER — Other Ambulatory Visit: Payer: Self-pay | Admitting: *Deleted

## 2012-03-04 ENCOUNTER — Telehealth: Payer: Self-pay | Admitting: Family Medicine

## 2012-03-04 ENCOUNTER — Ambulatory Visit (INDEPENDENT_AMBULATORY_CARE_PROVIDER_SITE_OTHER)
Admission: RE | Admit: 2012-03-04 | Discharge: 2012-03-04 | Disposition: A | Discharge status: Home / Self Care | Payer: Medicare Other | Source: Ambulatory Visit | Attending: Family Medicine | Admitting: Family Medicine

## 2012-03-04 ENCOUNTER — Other Ambulatory Visit: Payer: Self-pay | Admitting: Family Medicine

## 2012-03-04 DIAGNOSIS — R300 Dysuria: Secondary | ICD-10-CM

## 2012-03-04 MED ORDER — SULFAMETHOXAZOLE-TRIMETHOPRIM 800-160 MG PO TABS: 1.0000 | ORAL_TABLET | Freq: Two times a day (BID) | ORAL | Status: AC

## 2012-03-04 MED ORDER — CYCLOBENZAPRINE HCL 5 MG PO TABS: 5.0000 mg | ORAL_TABLET | Freq: Three times a day (TID) | ORAL | Status: DC | PRN

## 2012-03-04 NOTE — Telephone Encounter (Signed)
Pt has been advised of results.

## 2012-03-04 NOTE — Telephone Encounter (Signed)
Caller: Megan Hobbs; PCP: Gwinda Passe); CB#: [phone];WGNF regarding being Seen in Office On 03/03/12 and Prescribed Omeprazole and She Can't Capsules- requesting changed to tablets. SHE WOULD ALSO LIKE RESULTS OF CT SCAN DONE TODAY-03/04/12. Spoke with Pharmacist and Tablets only sold OTC- not in RX strength. She is going to try open up cap and mixing with foods. Medication Question Protocol.

## 2012-03-04 NOTE — Telephone Encounter (Signed)
See result note from CT scan and please advise pt of results.

## 2012-03-05 LAB — URINE CULTURE: Colony Count: 5000

## 2012-03-08 ENCOUNTER — Other Ambulatory Visit (INDEPENDENT_AMBULATORY_CARE_PROVIDER_SITE_OTHER): Payer: Medicare Other

## 2012-03-08 ENCOUNTER — Other Ambulatory Visit: Payer: Self-pay | Admitting: Family Medicine

## 2012-03-08 ENCOUNTER — Telehealth: Payer: Self-pay

## 2012-03-08 DIAGNOSIS — R319 Hematuria, unspecified: Secondary | ICD-10-CM

## 2012-03-08 LAB — URINALYSIS, ROUTINE W REFLEX MICROSCOPIC
Ketones, ur: NEGATIVE
Specific Gravity, Urine: 1.01 (ref 1.000–1.030)
Total Protein, Urine: NEGATIVE
Urine Glucose: NEGATIVE

## 2012-03-08 NOTE — Progress Notes (Signed)
Tasha in lab said she would order u/a with micro.

## 2012-03-08 NOTE — Telephone Encounter (Signed)
Pt scheduled lab appt today at 12:30 for repeat urinalysis with micro per Dr Dayton Martes.

## 2012-03-11 ENCOUNTER — Encounter: Payer: Self-pay | Admitting: Family Medicine

## 2012-03-11 ENCOUNTER — Ambulatory Visit (INDEPENDENT_AMBULATORY_CARE_PROVIDER_SITE_OTHER): Payer: Medicare Other | Admitting: Family Medicine

## 2012-03-11 VITALS — BP 118/80 | HR 82 | Temp 98.0°F | Ht <= 58 in | Wt 109.5 lb

## 2012-03-11 DIAGNOSIS — B373 Candidiasis of vulva and vagina: Secondary | ICD-10-CM | POA: Insufficient documentation

## 2012-03-11 LAB — POCT WET PREP (WET MOUNT): Trichomonas Wet Prep HPF POC: NEGATIVE

## 2012-03-11 MED ORDER — TERCONAZOLE 0.8 % VA CREA: 1.0000 | TOPICAL_CREAM | Freq: Every day | VAGINAL | Status: AC

## 2012-03-11 NOTE — Progress Notes (Signed)
Subjective:    Patient ID: Megan Hobbs, female    DOB: August 02, 1941, 71 y.o.   MRN: [id number]  HPI Thinks she may have a vaginal infection from abx  Was on bactrim DS px 5/31 - for urinary infection  Vaginal burning  No discharge  Just a little itching (does not scratch)   Cold water makes it feel much better   Urinates frequently because she is drinking lots of water   No pelvic pain   Patient Active Problem List  Diagnoses  . HYPERLIPIDEMIA  . CARPAL TUNNEL SYNDROME  . ALLERGIC RHINITIS  . G E R D  . COLITIS  . CERVICAL POLYP  . VAGINISMUS  . DISORDER, MENOPAUSAL NOS  . MUSCLE SPASM, TRAPEZIUS MUSCLE, RIGHT  . OSTEOPENIA  . Other Malaise and Fatigue  . LOCALIZED SUPERFICIAL SWELLING MASS OR LUMP  . ANKLE EDEMA  . CHEST PAIN  . OTHER DRUG ALLERGY  . CERUMEN IMPACTION, RECURRENT  . INTERNAL DERANGEMENT, LEFT KNEE  . POPLITEAL CYST, LEFT  . OTHER ACUTE SINUSITIS  . BACK PAIN, ACUTE  . COUGH  . Routine general medical examination at a health care facility  . Acute maxillary sinusitis  . Back pain  . Hematuria   Past Medical History  Diagnosis Date  . Depression   . Hyperlipidemia   . Osteopenia   . Allergy   . Anxiety    Past Surgical History  Procedure Date  . Appendectomy 1963   History  Substance Use Topics  . Smoking status: Never Smoker   . Smokeless tobacco: Never Used   Comment: teens  . Alcohol Use: No   Family History  Problem Relation Age of Onset  . Stroke Father 53  . Pneumonia Mother   . Stroke Mother     RAD  . Hypertension Brother     multiple brothers  . Hypertension Sister     multiple sisters  . Diabetes Daughter   . Diabetes Sister   . Cancer Sister     ? hysterectomy  . Cancer Sister     leukemia                                 Allergies  Allergen Reactions  . Amoxicillin-Pot Clavulanate     REACTION: rash all over  . Latex   . Ciprofloxacin Rash   Current Outpatient Prescriptions on File Prior to Visit    Medication Sig Dispense Refill  . ALPRAZolam (XANAX) 0.25 MG tablet TAKE ONE-HALF TO ONE TABLET BY MOUTH EVERY DAY AS NEEDED  30 tablet  0  . aspirin 81 MG tablet Take 81 mg by mouth daily.        Marland Kitchen atorvastatin (LIPITOR) 20 MG tablet TAKE ONE TABLET BY MOUTH EVERY DAY  30 tablet  6  . B Complex-C (SUPER B COMPLEX PO) Take 1 tablet by mouth daily.        Marland Kitchen BIOTIN 5000 PO Take 10,000 mEq by mouth daily.        . Calcium Carbonate-Vitamin D (CALCIUM + D PO) Take 1 tablet by mouth daily.        . cholecalciferol (VITAMIN D) 1000 UNITS tablet Take 1,000 Units by mouth daily.        . Cholecalciferol (VITAMIN D-3) 5000 UNITS TABS Take 1 tablet by mouth daily.        . Cyanocobalamin (B-12) 500 MCG TABS Take 1 tablet by  mouth daily. 1000 UNITS      . cyclobenzaprine (FLEXERIL) 5 MG tablet Take 1 tablet (5 mg total) by mouth 3 (three) times daily as needed for muscle spasms.  30 tablet  0  . Fish Oil OIL Take 1 tablet by mouth 2 (two) times daily.        . fluticasone (FLONASE) 50 MCG/ACT nasal spray Place 2 sprays into the nose daily.  16 g  3  . folic acid (FOLVITE) 400 MCG tablet Take 400 mcg by mouth daily.        . Loratadine-Pseudoephedrine (CLARITIN-D 12 HOUR PO) Take 1 tablet by mouth daily.        . Multiple Minerals-Vitamins (CALCIUM CITRATE +) TABS Take 1 tablet by mouth 2 (two) times daily.        . Multiple Vitamin (MULTIVITAMIN) tablet Take 1 tablet by mouth daily.        Marland Kitchen omeprazole (PRILOSEC) 40 MG capsule Take 1 capsule (40 mg total) by mouth daily.  30 capsule  6  . oxymetazoline (AFRIN) 0.05 % nasal spray Place 2 sprays into the nose 2 (two) times daily.      . sertraline (ZOLOFT) 50 MG tablet TAKE ONE TABLET BY MOUTH EVERY DAY  30 tablet  6  . zolpidem (AMBIEN) 10 MG tablet TAKE ONE TABLET BY MOUTH AT BEDTIME  30 tablet  1  . sulfamethoxazole-trimethoprim (BACTRIM DS) 800-160 MG per tablet Take 1 tablet by mouth 2 (two) times daily.  6 tablet  0       Review of  Systems Review of Systems  Constitutional: Negative for fever, appetite change, fatigue and unexpected weight change.  Eyes: Negative for pain and visual disturbance.  Respiratory: Negative for cough and shortness of breath.   Cardiovascular: Negative for cp or palpitations    Gastrointestinal: Negative for nausea, diarrhea and constipation.  Genitourinary: Negative for urgency and and pos for frequency (from drinking water) , neg for blood in urine  Skin: Negative for pallor or rash   Neurological: Negative for weakness, light-headedness, numbness and headaches.  Hematological: Negative for adenopathy. Does not bruise/bleed easily.  Psychiatric/Behavioral: Negative for dysphoric mood. The patient is not nervous/anxious.         Objective:   Physical Exam  Constitutional: She appears well-developed and well-nourished. No distress.  HENT:  Mouth/Throat: Oropharynx is clear and moist.  Eyes: Conjunctivae and EOM are normal. Pupils are equal, round, and reactive to light.  Cardiovascular: Normal rate and regular rhythm.   Pulmonary/Chest: Effort normal and breath sounds normal.  Abdominal: Soft. Bowel sounds are normal.       No suprapubic tenderness    Genitourinary: Vaginal discharge found.       Mildly hyperemic vag mucosa  Scant white discharge  No excoriation  Lymphadenopathy:    She has no cervical adenopathy.  Neurological: She is alert.  Skin: Skin is warm and dry.  Psychiatric: She has a normal mood and affect.          Assessment & Plan:

## 2012-03-11 NOTE — Patient Instructions (Signed)
Use the terazol cream for vaginal yeast infection  Update [place] if not improved after 1 week  Keep drinking your water

## 2012-03-11 NOTE — Assessment & Plan Note (Signed)
After tx with bactrim recently  Mild amt yeast seen on wet prep  tx with terazol 3 cream and update  Will call if worse or new symptoms

## 2012-03-14 ENCOUNTER — Telehealth: Payer: Self-pay | Admitting: Family Medicine

## 2012-03-14 MED ORDER — FLUCONAZOLE 150 MG PO TABS: 150.0000 mg | ORAL_TABLET | Freq: Once | ORAL | Status: AC

## 2012-03-14 NOTE — Telephone Encounter (Signed)
Caller: Naysha/Patient; PCP: Ruthe Mannan (Nestor Ramp); CB#: [phone];  Call regarding starting with  Vaginal irritation on 03/10/12 and seen in the office and RX Terezol Cream for 3 nights in a row. She is still feeling burning-03/14/12. She is wondering if Oral medication can be called in  to [facility] on [address] [phone]. Does NOT want generic. Triage and Care advice per Vaginal Irritation Protocol.

## 2012-03-14 NOTE — Telephone Encounter (Signed)
Rx for diflucan sent

## 2012-03-14 NOTE — Telephone Encounter (Signed)
Advised patient

## 2012-04-26 ENCOUNTER — Other Ambulatory Visit: Payer: Self-pay | Admitting: Family Medicine

## 2012-04-26 NOTE — Telephone Encounter (Signed)
Medicine called to pharmacy. 

## 2012-05-23 ENCOUNTER — Other Ambulatory Visit: Payer: Self-pay | Admitting: Family Medicine

## 2012-05-23 NOTE — Telephone Encounter (Signed)
Medicines called to [facility]. 

## 2012-06-02 ENCOUNTER — Other Ambulatory Visit: Payer: Self-pay | Admitting: Family Medicine

## 2012-06-02 NOTE — Telephone Encounter (Signed)
Ok to refill In Dr. Elmer Sow absence? Not on current med list.

## 2012-07-11 ENCOUNTER — Encounter: Payer: Self-pay | Admitting: Family Medicine

## 2012-07-11 ENCOUNTER — Ambulatory Visit (INDEPENDENT_AMBULATORY_CARE_PROVIDER_SITE_OTHER): Payer: Medicare Other | Admitting: Family Medicine

## 2012-07-11 VITALS — BP 120/82 | HR 68 | Temp 98.1°F | Ht <= 58 in | Wt 109.0 lb

## 2012-07-11 DIAGNOSIS — R102 Pelvic and perineal pain: Secondary | ICD-10-CM

## 2012-07-11 DIAGNOSIS — N949 Unspecified condition associated with female genital organs and menstrual cycle: Secondary | ICD-10-CM

## 2012-07-11 DIAGNOSIS — Z23 Encounter for immunization: Secondary | ICD-10-CM

## 2012-07-11 DIAGNOSIS — Z0000 Encounter for general adult medical examination without abnormal findings: Secondary | ICD-10-CM

## 2012-07-11 DIAGNOSIS — M949 Disorder of cartilage, unspecified: Secondary | ICD-10-CM

## 2012-07-11 DIAGNOSIS — E785 Hyperlipidemia, unspecified: Secondary | ICD-10-CM

## 2012-07-11 DIAGNOSIS — Z1231 Encounter for screening mammogram for malignant neoplasm of breast: Secondary | ICD-10-CM

## 2012-07-11 DIAGNOSIS — M899 Disorder of bone, unspecified: Secondary | ICD-10-CM

## 2012-07-11 LAB — COMPREHENSIVE METABOLIC PANEL
AST: 21 U/L (ref 0–37)
Albumin: 3.9 g/dL (ref 3.5–5.2)
Alkaline Phosphatase: 55 U/L (ref 39–117)
CO2: 31 mEq/L (ref 19–32)
Calcium: 9.2 mg/dL (ref 8.4–10.5)
Creatinine, Ser: 0.6 mg/dL (ref 0.4–1.2)
Total Bilirubin: 0.7 mg/dL (ref 0.3–1.2)
Total Protein: 7.1 g/dL (ref 6.0–8.3)

## 2012-07-11 LAB — LIPID PANEL
Cholesterol: 195 mg/dL (ref 0–200)
Total CHOL/HDL Ratio: 3
Triglycerides: 197 mg/dL — ABNORMAL HIGH (ref 0.0–149.0)

## 2012-07-11 MED ORDER — SELENIUM SULFIDE 2.5 % EX LOTN: TOPICAL_LOTION | Freq: Every day | CUTANEOUS | Status: DC | PRN

## 2012-07-11 NOTE — Addendum Note (Signed)
Addended by: Eliezer Bottom on: 07/11/2012 10:00 AM   Modules accepted: Orders

## 2012-07-11 NOTE — Patient Instructions (Addendum)
Good to see you. Please stop by to see to [facility] on your way out to set up your mammogram, bone density, and gynecology appointments.  We will call you with your lab results.

## 2012-07-11 NOTE — Progress Notes (Signed)
59 you here for: I have personally reviewed the Medicare Annual Wellness questionnaire and have noted 1. The patient's medical and social history 2. Their use of alcohol, tobacco or illicit drugs 3. Their current medications and supplements 4. The patient's functional ability including ADL's, fall risks, home safety risks and hearing or visual             Impairment- independent for all ADLs, AIDLs. 5. Diet and physical activities- her job requires heavy lifting, mod physical activity. 6. Evidence for depression or mood disorders- mood is stable.   HLD- well controlled on Lipitor 20 mg daily. Lab Results  Component Value Date   CHOL 175 03/09/2011   HDL 75.10 03/09/2011   LDLCALC 77 03/09/2011   LDLDIRECT 137.2 09/22/2010   TRIG 113.0 03/09/2011   CHOLHDL 2 03/09/2011     Anxiety and depression- currently controlled with Zoloft 50 mg daily and as needed Alprazolam. Says she does better now because she is working full time, less time for her to get anxious about things. No SI or HI.  Well woman-  Due for pneumovax, flu shot.  Pelvic pain- wants to see a GYN.  Has intermittent vaginal/pelvic pain and off for years.  No post menopausal bleeding.   The PMH, PSH, Social History, Family History, Medications, and allergies have been reviewed in [facility], and have been updated if relevant.   Review of Systems       See HPI Patient reports no  vision/ hearing changes,anorexia, weight change, fever ,adenopathy, persistant / recurrent hoarseness, swallowing issues, chest pain, edema,persistant / recurrent cough, hemoptysis, dyspnea(rest, exertional, paroxysmal nocturnal), gastrointestinal  bleeding (melena, rectal bleeding), abdominal pain, excessive heart burn, GU symptoms(dysuria, hematuria, pyuria, voiding/incontinence  Issues) syncope, focal weakness, severe memory loss, concerning skin lesions, depression, anxiety, abnormal bruising/bleeding, major joint swelling, breast masses or abnormal vaginal  bleeding.     Physical Exam BP 120/82  Pulse 68  Temp 98.1 F (36.7 C)  Ht 4\' 9"  (1.448 m)  Wt 109 lb (49.442 kg)  BMI 23.59 kg/m2  General:  Well-developed,well-nourished,in no acute distress; alert,appropriate and cooperative throughout examination Head:  normocephalic and atraumatic.   Eyes:  vision grossly intact, pupils equal, pupils round, and pupils reactive to light.   Ears:  R ear normal and L ear normal.   Nose:  no external deformity.   Mouth:  good dentition.   Neck:  No deformities, masses, or tenderness noted. Breasts:  No mass, nodules, thickening, tenderness, bulging, retraction, inflamation, nipple discharge or skin changes noted.   Lungs:  Normal respiratory effort, chest expands symmetrically. Lungs are clear to auscultation, no crackles or wheezes. Heart:  Normal rate and regular rhythm. S1 and S2 normal without gallop, murmur, click, rub or other extra sounds. Abdomen:  Bowel sounds positive,abdomen soft and non-tender without masses, organomegaly or hernias noted. Msk:  No deformity or scoliosis noted of thoracic or lumbar spine.   Extremities:  No clubbing, cyanosis, edema, or deformity noted with normal full range of motion of all joints.   Neurologic:  alert & oriented X3 and gait normal.   Skin:  Intact without suspicious lesions or rashes Psych:  Cognition and judgment appear intact. Alert and cooperative with normal attention span and concentration. No apparent delusions, illusions, hallucinations  Assessment and Plan: 1. Routine general medical examination at a health care facility  The patients weight, height, BMI and visual acuity have been recorded in the chart I have made referrals, counseling and provided education to  the patient based review of the above and I have provided the pt with a written personalized care plan for preventive services.  Comprehensive metabolic panel  2. OSTEOPENIA  DG Bone Density  3. HYPERLIPIDEMIA  Recheck labs today.  Lipid Panel  4. Other screening mammogram  MM Digital Screening  5. Pelvic pain in female  Pt declined gyn exam today, wants referral to GYN. Referral placed. Ambulatory referral to Gynecology

## 2012-07-14 ENCOUNTER — Telehealth: Payer: Self-pay

## 2012-07-14 NOTE — Telephone Encounter (Signed)
Melissa with  [facility] [address] said selsun shampoo comes as 2.25% and lotion is 2.5%(sometimes lotion is used as a shampoo). rx sent was for selsun shampoo 2.5%. Please clarify.

## 2012-07-14 NOTE — Telephone Encounter (Signed)
Noted! Thank you

## 2012-07-14 NOTE — Telephone Encounter (Signed)
Changed script to 2.25% shampoo and changed on med list as well.  Pharmacist said this may have formerly been available as 2.5%, but no longer is.  I did see pt's bottle when she was here for office visit and it did say shampoo, 2.5%.

## 2012-07-14 NOTE — Telephone Encounter (Signed)
Please verify with pt which one she is using (this is a long term rx). Thanks.

## 2012-07-18 ENCOUNTER — Ambulatory Visit
Admission: RE | Admit: 2012-07-18 | Discharge: 2012-07-18 | Disposition: A | Discharge status: Home / Self Care | Payer: Medicare Other | Source: Ambulatory Visit | Attending: Family Medicine | Admitting: Family Medicine

## 2012-07-18 DIAGNOSIS — M899 Disorder of bone, unspecified: Secondary | ICD-10-CM

## 2012-07-19 ENCOUNTER — Encounter: Payer: Self-pay | Admitting: Obstetrics & Gynecology

## 2012-07-19 ENCOUNTER — Ambulatory Visit (INDEPENDENT_AMBULATORY_CARE_PROVIDER_SITE_OTHER): Payer: Medicare Other | Admitting: Obstetrics & Gynecology

## 2012-07-19 ENCOUNTER — Other Ambulatory Visit: Payer: Self-pay | Admitting: Family Medicine

## 2012-07-19 ENCOUNTER — Other Ambulatory Visit (HOSPITAL_COMMUNITY)
Admission: RE | Admit: 2012-07-19 | Discharge: 2012-07-19 | Disposition: A | Discharge status: Home / Self Care | Payer: Medicare Other | Source: Ambulatory Visit | Attending: Obstetrics & Gynecology | Admitting: Obstetrics & Gynecology

## 2012-07-19 VITALS — BP 147/90 | HR 80 | Ht <= 58 in | Wt 113.0 lb

## 2012-07-19 DIAGNOSIS — Z124 Encounter for screening for malignant neoplasm of cervix: Secondary | ICD-10-CM | POA: Insufficient documentation

## 2012-07-19 DIAGNOSIS — R102 Pelvic and perineal pain: Secondary | ICD-10-CM

## 2012-07-19 DIAGNOSIS — Z0000 Encounter for general adult medical examination without abnormal findings: Secondary | ICD-10-CM

## 2012-07-19 DIAGNOSIS — Z1151 Encounter for screening for human papillomavirus (HPV): Secondary | ICD-10-CM

## 2012-07-19 DIAGNOSIS — R300 Dysuria: Secondary | ICD-10-CM

## 2012-07-19 DIAGNOSIS — N949 Unspecified condition associated with female genital organs and menstrual cycle: Secondary | ICD-10-CM

## 2012-07-19 LAB — POCT URINALYSIS DIPSTICK
Bilirubin, UA: NEGATIVE
Blood, UA: NEGATIVE
Nitrite, UA: NEGATIVE
Spec Grav, UA: <=1.005
pH, UA: 5

## 2012-07-19 NOTE — Progress Notes (Signed)
  Subjective:    Patient ID: Megan Hobbs, female    DOB: December 07, 1940, 71 y.o.   MRN: [id number]  HPI  Megan Hobbs is a 71 yo [employer] lady who is here because of a long standing (I was unable to elicit, even with extensive questioning, exactly how long the pain has been present) pelvic discomfort, "like when you are going to start your period". She would also like a pap smear (I have explained that we generally stop doing pap smears at 70 yo).   Review of Systems Her mammogram is scheduled for 12/13. Her DEXA is UTD. She is not sexually active.    Objective:   Physical Exam  Appears much younger than her stated age. Moderate atrophy of vulva/vagina Normal appearing cervix Uterus- NSSA, NT, no adnexal masses or tenderness UA negative      Assessment & Plan:   Pelvic discomfort. I will get an u/s and urine culture and she will follow up.

## 2012-07-20 ENCOUNTER — Other Ambulatory Visit: Payer: Self-pay | Admitting: Family Medicine

## 2012-07-21 ENCOUNTER — Other Ambulatory Visit: Payer: Self-pay | Admitting: Family Medicine

## 2012-07-21 LAB — URINE CULTURE
Colony Count: NO GROWTH
Organism ID, Bacteria: NO GROWTH

## 2012-07-21 NOTE — Telephone Encounter (Signed)
Medicines called to pharmacy. 

## 2012-07-29 ENCOUNTER — Ambulatory Visit (INDEPENDENT_AMBULATORY_CARE_PROVIDER_SITE_OTHER): Payer: Medicare Other | Admitting: Family Medicine

## 2012-07-29 ENCOUNTER — Encounter: Payer: Self-pay | Admitting: Family Medicine

## 2012-07-29 VITALS — BP 144/84 | HR 85 | Temp 98.3°F | Wt 112.0 lb

## 2012-07-29 DIAGNOSIS — J069 Acute upper respiratory infection, unspecified: Secondary | ICD-10-CM

## 2012-07-29 DIAGNOSIS — J029 Acute pharyngitis, unspecified: Secondary | ICD-10-CM

## 2012-07-29 LAB — POCT RAPID STREP A (OFFICE): Rapid Strep A Screen: NEGATIVE

## 2012-07-29 MED ORDER — BENZONATATE 200 MG PO CAPS: 200.0000 mg | ORAL_CAPSULE | Freq: Three times a day (TID) | ORAL | Status: AC | PRN

## 2012-07-29 NOTE — Progress Notes (Signed)
Sx started with a ST, a few days ago.  Cough with clear sputum production.  HA, sweats, chills, burning in throat.  Coughing in fits.  [place], mult sick exposures.  No ear pain now but can feel stuffy.  Still using flonase.    Meds, vitals, and allergies reviewed.   ROS: See HPI.  Otherwise, noncontributory.  GEN: nad, alert and oriented HEENT: mucous membranes moist, tm w/o erythema, nasal exam w/o erythema, clear discharge noted,  OP with cobblestoning, sinuses not ttp NECK: supple w/o LA CV: rrr.   PULM: ctab, no inc wob EXT: no edema SKIN: no acute rash

## 2012-07-29 NOTE — Patient Instructions (Addendum)
Take tessalon for cough during the day.  Delsym at night for cough.  Drink plenty of fluids, take tylenol as needed, and gargle with warm salt water for your throat.  This should gradually improve.  Take care.  Let us know if you have other concerns.

## 2012-07-31 DIAGNOSIS — J069 Acute upper respiratory infection, unspecified: Secondary | ICD-10-CM | POA: Insufficient documentation

## 2012-07-31 NOTE — Assessment & Plan Note (Signed)
Likely viral, nontoxic.  Would use tessalon for cough along with delsym and this should resolve.  Work note given; she is potentially contagious.  Okay for outpatient f/u.  D/w pt, she agrees with plan.

## 2012-08-03 ENCOUNTER — Encounter: Payer: Self-pay | Admitting: Family Medicine

## 2012-08-03 ENCOUNTER — Ambulatory Visit (INDEPENDENT_AMBULATORY_CARE_PROVIDER_SITE_OTHER): Payer: Medicare Other | Admitting: Family Medicine

## 2012-08-03 VITALS — BP 132/92 | HR 82 | Temp 98.5°F | Ht <= 58 in | Wt 113.8 lb

## 2012-08-03 DIAGNOSIS — J209 Acute bronchitis, unspecified: Secondary | ICD-10-CM | POA: Insufficient documentation

## 2012-08-03 MED ORDER — AZITHROMYCIN 250 MG PO TABS: ORAL_TABLET | ORAL | Status: DC

## 2012-08-03 NOTE — Patient Instructions (Addendum)
Drink lots of fluids and get rest  Stay out of work as advised  Here is a note Take the zithromax as directed Update if not starting to improve in a week or if worsening

## 2012-08-03 NOTE — Assessment & Plan Note (Signed)
Worsening symptoms since visit on 10/25 with productive cough and laryngitis Unable to tell if viral or bacterial - but she does describe symptoms of fever  Will cover with zpak Continue tessalon I adv her to stay out of work until nov 2 and note written  Update if not starting to improve in a week or if worsening   Disc symptomatic care - see instructions on AVS

## 2012-08-03 NOTE — Progress Notes (Signed)
Subjective:    Patient ID: Megan Hobbs, female    DOB: 1941/04/04, 71 y.o.   MRN: [id number]  HPI Saw Dr Para March on the 25th  Dx with viral uri  Symptomatic care   Now very hoarse for 3-4 days- can barely talk  Coughing up some mucous - has clear mucous  No wheeze  Sore throat- worse on R side this am  A little better now   Does salt water gargle and tea   No fever  ? If chills at home   She really feels like she needs to go back to work today  Knows she is contagious - and her job will not allow her to wear a mask   Patient Active Problem List  Diagnosis  . HYPERLIPIDEMIA  . CARPAL TUNNEL SYNDROME  . ALLERGIC RHINITIS  . G E R D  . COLITIS  . CERVICAL POLYP  . VAGINISMUS  . DISORDER, MENOPAUSAL NOS  . MUSCLE SPASM, TRAPEZIUS MUSCLE, RIGHT  . OSTEOPENIA  . Other Malaise and Fatigue  . LOCALIZED SUPERFICIAL SWELLING MASS OR LUMP  . ANKLE EDEMA  . CHEST PAIN  . OTHER DRUG ALLERGY  . CERUMEN IMPACTION, RECURRENT  . INTERNAL DERANGEMENT, LEFT KNEE  . POPLITEAL CYST, LEFT  . OTHER ACUTE SINUSITIS  . BACK PAIN, ACUTE  . COUGH  . Routine general medical examination at a health care facility  . Back pain  . Hematuria  . Yeast vaginitis  . URI (upper respiratory infection)   Past Medical History  Diagnosis Date  . Depression   . Hyperlipidemia   . Osteopenia   . Allergy   . Anxiety    Past Surgical History  Procedure Date  . Appendectomy 1963   History  Substance Use Topics  . Smoking status: Former Smoker    Types: Cigarettes  . Smokeless tobacco: Never Used   Comment: teens  . Alcohol Use: No   Family History  Problem Relation Age of Onset  . Stroke Father 73  . Pneumonia Mother   . Stroke Mother     RAD  . Hypertension Brother     multiple brothers  . Hypertension Sister     multiple sisters  . Diabetes Daughter   . Diabetes Sister   . Cancer Sister     ? hysterectomy  . Cancer Sister     leukemia                                  Allergies  Allergen Reactions  . Amoxicillin-Pot Clavulanate     REACTION: rash all over  . Latex   . Ciprofloxacin Rash   Current Outpatient Prescriptions on File Prior to Visit  Medication Sig Dispense Refill  . ALPRAZolam (XANAX) 0.25 MG tablet TAKE ONE-HALF TO ONE TABLET BY MOUTH EVERY DAY AS NEEDED  30 tablet  0  . aspirin 81 MG tablet Take 81 mg by mouth daily.        Marland Kitchen atorvastatin (LIPITOR) 20 MG tablet TAKE ONE TABLET BY MOUTH EVERY DAY  30 tablet  11  . augmented betamethasone dipropionate (DIPROLENE-AF) 0.05 % ointment USE AS DIRECTED ON SCALP  15 g  0  . B Complex-C (SUPER B COMPLEX PO) Take 1 tablet by mouth daily.        . benzonatate (TESSALON) 200 MG capsule Take 1 capsule (200 mg total) by mouth 3 (three) times daily as needed  for cough.  30 capsule  1  . BIOTIN 5000 PO Take 10,000 mEq by mouth daily.        . Calcium Carbonate-Vitamin D (CALCIUM + D PO) Take 1 tablet by mouth daily.        . Cholecalciferol (VITAMIN D-3) 5000 UNITS TABS Take 1 tablet by mouth daily.        . Cyanocobalamin (B-12) 500 MCG TABS Take 1 tablet by mouth daily. 1000 UNITS      . Fish Oil OIL Take 1 tablet by mouth 2 (two) times daily.        . folic acid (FOLVITE) 400 MCG tablet Take 400 mcg by mouth daily.        . Loratadine-Pseudoephedrine (CLARITIN-D 12 HOUR PO) Take 1 tablet by mouth daily.        . Multiple Minerals-Vitamins (CALCIUM CITRATE +) TABS Take 1 tablet by mouth 2 (two) times daily.        . Multiple Vitamin (MULTIVITAMIN) tablet Take 1 tablet by mouth daily.        Marland Kitchen omeprazole (PRILOSEC) 20 MG capsule Take 20 mg by mouth daily.      Marland Kitchen oxymetazoline (AFRIN) 0.05 % nasal spray Place 2 sprays into the nose 2 (two) times daily.      . sertraline (ZOLOFT) 50 MG tablet TAKE ONE TABLET BY MOUTH EVERY DAY  30 tablet  6  . zolpidem (AMBIEN) 10 MG tablet TAKE ONE TABLET BY MOUTH AT BEDTIME  30 tablet  0      Review of Systems Review of Systems  Constitutional: Negative for  fever, appetite change,  and unexpected weight change. pos for fatigue and chills/ sweats Eyes: Negative for pain and visual disturbance.  Respiratory: Negative for wheeze / pos for prod cough ENT pos for hoarseness/ some sinus pressure  Cardiovascular: Negative for cp or palpitations    Gastrointestinal: Negative for nausea, diarrhea and constipation.  Genitourinary: Negative for urgency and frequency.  Skin: Negative for pallor or rash   Neurological: Negative for weakness, light-headedness, numbness and headaches.  Hematological: Negative for adenopathy. Does not bruise/bleed easily.  Psychiatric/Behavioral: Negative for dysphoric mood. The patient is not nervous/anxious.         Objective:   Physical Exam  Constitutional: She appears well-developed and well-nourished. No distress.       Very hoarse and with persistent cough  HENT:  Head: Normocephalic and atraumatic.  Right Ear: External ear normal.  Left Ear: External ear normal.  Mouth/Throat: Oropharynx is clear and moist. No oropharyngeal exudate.       Very hoarse  bilat maxillary sinus tenderness Nares are injected and congested   Throat- post nasal drip  Eyes: Conjunctivae normal and EOM are normal. Right eye exhibits no discharge. Left eye exhibits no discharge.  Neck: Normal range of motion. Neck supple. No thyromegaly present.  Cardiovascular: Normal rate, regular rhythm and normal heart sounds.   Pulmonary/Chest: Effort normal and breath sounds normal. No respiratory distress. She has no wheezes. She has no rales. She exhibits no tenderness.       Very harsh bs at bases with isolated rhonchi but no wheeze or rales spasmotic cough   Musculoskeletal: She exhibits no edema.  Lymphadenopathy:    She has no cervical adenopathy.  Neurological: She is alert.  Skin: Skin is warm and dry. No rash noted.  Psychiatric: She has a normal mood and affect.          Assessment & Plan:

## 2012-08-08 ENCOUNTER — Other Ambulatory Visit: Payer: Medicare Other

## 2012-08-08 ENCOUNTER — Ambulatory Visit
Admission: RE | Admit: 2012-08-08 | Discharge: 2012-08-08 | Disposition: A | Discharge status: Home / Self Care | Payer: Medicare Other | Source: Ambulatory Visit | Attending: Family Medicine | Admitting: Family Medicine

## 2012-08-08 DIAGNOSIS — Z1231 Encounter for screening mammogram for malignant neoplasm of breast: Secondary | ICD-10-CM

## 2012-08-15 ENCOUNTER — Encounter: Payer: Self-pay | Admitting: Family Medicine

## 2012-08-19 ENCOUNTER — Other Ambulatory Visit: Payer: Self-pay | Admitting: Family Medicine

## 2012-08-19 NOTE — Telephone Encounter (Signed)
Refill request for ambien, alprazolam has already been called in.

## 2012-08-19 NOTE — Telephone Encounter (Signed)
Medicine called to [facility]. 

## 2012-08-19 NOTE — Telephone Encounter (Signed)
ambien called to [facility]. 

## 2012-09-02 ENCOUNTER — Ambulatory Visit: Payer: Medicare Other | Admitting: Family Medicine

## 2012-09-15 ENCOUNTER — Other Ambulatory Visit: Payer: Self-pay | Admitting: Family Medicine

## 2012-09-15 NOTE — Telephone Encounter (Signed)
Electronic refill request.  Please advise. 

## 2012-09-16 ENCOUNTER — Other Ambulatory Visit: Payer: Self-pay | Admitting: Family Medicine

## 2012-09-16 NOTE — Telephone Encounter (Signed)
Medicines called to [facility]. 

## 2012-10-18 ENCOUNTER — Other Ambulatory Visit: Payer: Self-pay | Admitting: Family Medicine

## 2012-10-18 NOTE — Telephone Encounter (Signed)
Medicines called to [facility].

## 2012-10-26 ENCOUNTER — Other Ambulatory Visit: Payer: Self-pay | Admitting: Family Medicine

## 2012-10-27 ENCOUNTER — Telehealth: Payer: Self-pay | Admitting: *Deleted

## 2012-10-27 NOTE — Telephone Encounter (Signed)
Rout to PCP 

## 2012-10-27 NOTE — Telephone Encounter (Signed)
Unfortunately I cannot prescribe abx without seeing pt.

## 2012-10-27 NOTE — Telephone Encounter (Signed)
Pt phone call was routed to me because Dr. Milinda Antis saw pt back on 08/03/13 and prescribed pt a z-pack, pt's pharm. Requested a z-pack Rx for pt today that Dr. Dayton Martes declined. Pt called stated she has a sinus infection and wanted a refill on her z-pack, advise pt that Dr. Milinda Antis gave Rx because she was seen on that day but she would need to be seen again to get another antibiotic, pt declined to schedule appt because she said she has to work tomorrow and wanted me to send this message to Dr. Dayton Martes to see if she would refill it, I advise her I would give Dr. Dayton Martes the message and Dr. Elmer Sow CMA would call her back

## 2012-10-28 NOTE — Telephone Encounter (Signed)
Left message on machine advising patient to call to schedule appointment.

## 2012-11-15 ENCOUNTER — Other Ambulatory Visit: Payer: Self-pay | Admitting: Family Medicine

## 2012-11-15 NOTE — Telephone Encounter (Signed)
Electronic refill request.  Please advise. 

## 2012-11-16 ENCOUNTER — Other Ambulatory Visit: Payer: Self-pay | Admitting: Family Medicine

## 2012-11-16 NOTE — Telephone Encounter (Signed)
Pt left v/m that she is out of [place] and request refilled today. Could not understand call back #.

## 2012-11-16 NOTE — Telephone Encounter (Signed)
Medicine approved by Dr. Dayton Martes in previous note from yesterday.  Called to [facility].

## 2012-11-21 ENCOUNTER — Ambulatory Visit: Payer: Medicare Other | Admitting: Family Medicine

## 2012-11-23 ENCOUNTER — Ambulatory Visit (INDEPENDENT_AMBULATORY_CARE_PROVIDER_SITE_OTHER): Payer: Medicare Other | Admitting: Family Medicine

## 2012-11-23 ENCOUNTER — Ambulatory Visit (INDEPENDENT_AMBULATORY_CARE_PROVIDER_SITE_OTHER)
Admission: RE | Admit: 2012-11-23 | Discharge: 2012-11-23 | Disposition: A | Discharge status: Home / Self Care | Payer: Medicare Other | Source: Ambulatory Visit | Attending: Family Medicine | Admitting: Family Medicine

## 2012-11-23 ENCOUNTER — Encounter: Payer: Self-pay | Admitting: Family Medicine

## 2012-11-23 VITALS — BP 120/70 | HR 82 | Temp 98.5°F | Ht <= 58 in | Wt 114.2 lb

## 2012-11-23 DIAGNOSIS — M25569 Pain in unspecified knee: Secondary | ICD-10-CM

## 2012-11-23 MED ORDER — DICLOFENAC SODIUM 75 MG PO TBEC: 75.0000 mg | DELAYED_RELEASE_TABLET | Freq: Two times a day (BID) | ORAL | Status: DC

## 2012-11-23 NOTE — Progress Notes (Signed)
[facility] at [facility] [address] [place] [address] Phone: [phone] Fax: [phone]  Date:  11/23/2012   Name:  Megan Hobbs   DOB:  17-Mar-1941   MRN:  [id number] Gender: female Age: 72 y.o.  Primary Physician:  Ruthe Mannan, MD  Evaluating MD: Hannah Beat, MD   Chief Complaint: Knee Pain   History of Present Illness:  Megan Hobbs is a 72 y.o. pleasant patient who presents with the following:  Right knee, with walking and with standing, with getting up a little bit, will bother her a little bit. Has not been bothering her on and off. Using some linament. Dring the night it does not bother her and leg will feel a pinch. Not with working, but it does only with walking.   Right knee pain for several months. It is worse at work and when going up and downstairs. It is worse when she is forcefully bending and rotating it. It is not locking up at all. No prior fractures or operative intervention. No prior injections. She has tried some Tylenol and Motrin without much success. She is very frustrated and feels very limited.  Patient Active Problem List  Diagnosis  . HYPERLIPIDEMIA  . CARPAL TUNNEL SYNDROME  . ALLERGIC RHINITIS  . G E R D  . COLITIS  . CERVICAL POLYP  . VAGINISMUS  . DISORDER, MENOPAUSAL NOS  . MUSCLE SPASM, TRAPEZIUS MUSCLE, RIGHT  . OSTEOPENIA  . Other Malaise and Fatigue  . LOCALIZED SUPERFICIAL SWELLING MASS OR LUMP  . ANKLE EDEMA  . CHEST PAIN  . OTHER DRUG ALLERGY  . CERUMEN IMPACTION, RECURRENT  . INTERNAL DERANGEMENT, LEFT KNEE  . POPLITEAL CYST, LEFT  . OTHER ACUTE SINUSITIS  . BACK PAIN, ACUTE  . COUGH  . Routine general medical examination at a health care facility  . Back pain  . Hematuria  . Yeast vaginitis  . URI (upper respiratory infection)  . Acute bronchitis    Past Medical History  Diagnosis Date  . Depression   . Hyperlipidemia   . Osteopenia   . Allergy   . Anxiety     Past Surgical  History  Procedure Laterality Date  . Appendectomy  1963    History  Substance Use Topics  . Smoking status: Former Smoker    Types: Cigarettes  . Smokeless tobacco: Never Used     Comment: teens  . Alcohol Use: No    Family History  Problem Relation Age of Onset  . Stroke Father 38  . Pneumonia Mother   . Stroke Mother     RAD  . Hypertension Brother     multiple brothers  . Hypertension Sister     multiple sisters  . Diabetes Daughter   . Diabetes Sister   . Cancer Sister     ? hysterectomy  . Cancer Sister     leukemia                                  Allergies  Allergen Reactions  . Amoxicillin-Pot Clavulanate     REACTION: rash all over  . Latex   . Ciprofloxacin Rash    Medication list has been reviewed and updated.  Outpatient Prescriptions Prior to Visit  Medication Sig Dispense Refill  . ALPRAZolam (XANAX) 0.25 MG tablet TAKE ONE-HALF TO ONE TABLET BY MOUTH EVERY DAY AS NEEDED  30 tablet  0  .  aspirin 81 MG tablet Take 81 mg by mouth daily.        Marland Kitchen atorvastatin (LIPITOR) 20 MG tablet TAKE ONE TABLET BY MOUTH EVERY DAY  30 tablet  11  . augmented betamethasone dipropionate (DIPROLENE-AF) 0.05 % ointment USE AS DIRECTED ON SCALP  15 g  0  . azithromycin (ZITHROMAX Z-PAK) 250 MG tablet Take 2 pills by mouth today and then 1 pill once daily for 4 days  6 each  0  . B Complex-C (SUPER B COMPLEX PO) Take 1 tablet by mouth daily.        Marland Kitchen BIOTIN 5000 PO Take 10,000 mEq by mouth daily.        . Calcium Carbonate-Vitamin D (CALCIUM + D PO) Take 1 tablet by mouth daily.        . Cholecalciferol (VITAMIN D-3) 5000 UNITS TABS Take 1 tablet by mouth daily.        . Cyanocobalamin (B-12) 500 MCG TABS Take 1 tablet by mouth daily. 1000 UNITS      . Fish Oil OIL Take 1 tablet by mouth 2 (two) times daily.        . folic acid (FOLVITE) 400 MCG tablet Take 400 mcg by mouth daily.        . Loratadine-Pseudoephedrine (CLARITIN-D 12 HOUR PO) Take 1 tablet by mouth daily.         . Multiple Minerals-Vitamins (CALCIUM CITRATE +) TABS Take 1 tablet by mouth 2 (two) times daily.        . Multiple Vitamin (MULTIVITAMIN) tablet Take 1 tablet by mouth daily.        Marland Kitchen omeprazole (PRILOSEC) 20 MG capsule Take 20 mg by mouth daily.      Marland Kitchen oxymetazoline (AFRIN) 0.05 % nasal spray Place 2 sprays into the nose 2 (two) times daily.      . sertraline (ZOLOFT) 50 MG tablet TAKE ONE TABLET BY MOUTH EVERY DAY  30 tablet  6  . sertraline (ZOLOFT) 50 MG tablet TAKE ONE TABLET BY MOUTH EVERY DAY  30 tablet  5  . zolpidem (AMBIEN) 10 MG tablet TAKE ONE TABLET BY MOUTH AT BEDTIME  30 tablet  0   No facility-administered medications prior to visit.    Review of Systems:   GEN: No fevers, chills. Nontoxic. Primarily MSK c/o today. MSK: Detailed in the HPI GI: tolerating PO intake without difficulty Neuro: No numbness, parasthesias, or tingling associated. Otherwise the pertinent positives of the ROS are noted above.    Physical Examination: BP 120/70  Pulse 82  Temp(Src) 98.5 F (36.9 C) (Oral)  Ht 4\' 9"  (1.448 m)  Wt 114 lb 4 oz (51.823 kg)  BMI 24.72 kg/m2  SpO2 97%  Ideal Body Weight: Weight in (lb) to have BMI = 25: 115.3   GEN: WDWN, NAD, Non-toxic, Alert & Oriented x 3 HEENT: Atraumatic, Normocephalic.  Ears and Nose: No external deformity. EXTR: No clubbing/cyanosis/edema NEURO: Normal gait.  PSYCH: Normally interactive. Conversant. Not depressed or anxious appearing.  Calm demeanor.   Knee:  R Gait: Normal heel toe pattern ROM: 0-125 Effusion: neg Echymosis or edema: none Patellar tendon NT Painful PLICA: neg Patellar grind: negative Medial and lateral patellar facet loading: negative medial and lateral joint lines: posterior medial joint line pain Mcmurray's neg Flexion-pinch neg Bounce home POS Varus and valgus stress: stable Lachman: neg Ant and Post drawer: neg Hip abduction, IR, ER: WNL Hip flexion str: 5/5 Hip abd: 5/5 Quad: 5/5 VMO  atrophy:No  Hamstring concentric and eccentric: 5/5   X-rays: AP Weight-bearing, Weightbearing Lateral, Sunrise views Indication: knee pain Findings:  Spaces are well preserved. No evidence of fracture or dislocation, minimal patellofemoral arthritis.  Assessment and Plan:  Right knee pain - Plan: DG Knee AP/LAT W/Sunrise Right  Right knee pain, more medial. More likely degenerative meniscal pathology.  Rest, ice, anti-inflammatories, and inject with corticosteroid.  Knee Injection, R Patient verbally consented to procedure. Risks (including potential rare risk of infection), benefits, and alternatives explained. Sterilely prepped with Chloraprep. Ethyl cholride used for anesthesia. 8 cc Lidocaine 1% mixed with 2 cc of Depo-Medrol 40 mg injected using the anterolateral approach without difficulty. No complications with procedure and tolerated well. Patient had decreased pain post-injection.   Orders Today:  Orders Placed This Encounter  Procedures  . DG Knee AP/LAT W/Sunrise Right    Standing Status: Future     Number of Occurrences: 1     Standing Expiration Date: 01/23/2014    Order Specific Question:  Reason for exam:    Answer:  r knee pain    Order Specific Question:  Preferred imaging location?    Answer:  [facility]    Updated Medication List: (Includes new medications, updates to list, dose adjustments) Meds ordered this encounter  Medications  . diclofenac (VOLTAREN) 75 MG EC tablet    Sig: Take 1 tablet (75 mg total) by mouth 2 (two) times daily.    Dispense:  60 tablet    Refill:  3    Medications Discontinued: There are no discontinued medications.    Signed, Elpidio Galea. Lael Wetherbee, MD 11/23/2012 12:51 PM

## 2012-12-08 ENCOUNTER — Other Ambulatory Visit: Payer: Self-pay | Admitting: Family Medicine

## 2012-12-08 NOTE — Telephone Encounter (Signed)
Medicine called to [facility]. 

## 2013-01-05 ENCOUNTER — Other Ambulatory Visit: Payer: Self-pay | Admitting: Family Medicine

## 2013-01-05 NOTE — Telephone Encounter (Signed)
Medicine called to [facility]. 

## 2013-01-09 ENCOUNTER — Telehealth: Payer: Self-pay | Admitting: Family Medicine

## 2013-01-09 NOTE — Telephone Encounter (Signed)
Patient Information:  Caller Name: Megan Hobbs  Phone: [phone]  Patient: Megan Hobbs  Gender: Female  DOB: 11/29/1940  Age: 72 Years  PCP: Megan Hobbs [facility])  Office Follow Up:  Does the office need to follow up with this patient?: Yes  Instructions For The Office: Pt rrying to understand why insurance company will only allow 15  Ambien pills to be filled.  She came in 01/06/13 and  filled out papaer work to be sent  to [employer] and she has not heard anything back yet.  Please call her and ler her know how this process works.   Symptoms  Reason For Call & Symptoms: Pt calling that she was only given 15 Ambien and in then past she ahs received 30 pills.   She called insurance and filled out a form as directed at th office.  Seh does not understand why she cannot get 30 pills refillled?  Reviewed Health History In EMR: N/A  Reviewed Medications In EMR: N/A  Reviewed Allergies In EMR: N/A  Reviewed Surgeries / Procedures: N/A  Date of Onset of Symptoms: Unknown  Guideline(s) Used:  No Protocol Available - Information Only  Disposition Per Guideline:   Discuss with PCP and Callback by Nurse Today  Reason For Disposition Reached:   Nursing judgment  Advice Given:  N/A  RN Overrode Recommendation:  Patient Requests Prescription  Ptying to understand why the Insurance company only allowed 15 Ambien to be filled.

## 2013-01-10 ENCOUNTER — Other Ambulatory Visit: Payer: Self-pay | Admitting: Family Medicine

## 2013-01-10 NOTE — Telephone Encounter (Signed)
Medicine called to [facility]. 

## 2013-01-18 ENCOUNTER — Other Ambulatory Visit: Payer: Self-pay | Admitting: Family Medicine

## 2013-01-18 NOTE — Telephone Encounter (Signed)
Medicine called to [facility], but it was last filled on 4/8, so they will hold refill until time.

## 2013-01-28 ENCOUNTER — Other Ambulatory Visit: Payer: Self-pay | Admitting: Family Medicine

## 2013-01-30 NOTE — Telephone Encounter (Signed)
Refill request for Megan Hobbs, last filled on 01/05/13.

## 2013-01-30 NOTE — Telephone Encounter (Signed)
Ambien called to pharmacy.  They will hold script until time to fill.

## 2013-02-28 ENCOUNTER — Other Ambulatory Visit: Payer: Self-pay | Admitting: Family Medicine

## 2013-02-28 NOTE — Telephone Encounter (Signed)
Called to [facility] 

## 2013-02-28 NOTE — Telephone Encounter (Addendum)
Ok to refill one time only. 

## 2013-03-22 ENCOUNTER — Other Ambulatory Visit: Payer: Self-pay | Admitting: *Deleted

## 2013-03-22 MED ORDER — SERTRALINE HCL 50 MG PO TABS: ORAL_TABLET | ORAL | Status: DC

## 2013-03-27 ENCOUNTER — Other Ambulatory Visit: Payer: Self-pay | Admitting: Family Medicine

## 2013-03-27 NOTE — Telephone Encounter (Signed)
Last filled 02/28/13

## 2013-03-28 NOTE — Telephone Encounter (Signed)
Pt left v/m requesting cb; left v/m advised pt to ck with pharmacy or call our office back.

## 2013-03-28 NOTE — Telephone Encounter (Signed)
Pt left v/m to ck on refill status ambien; left v/m for pt to call back to let her know rx requested too early; spoke with [place] at [facility] [address] advised refused; requested too early and Neysa Bonito will resubmit in couple of days. Pt did get filled on 02/28/13.

## 2013-03-30 ENCOUNTER — Other Ambulatory Visit: Payer: Self-pay | Admitting: Family Medicine

## 2013-03-30 MED ORDER — ZOLPIDEM TARTRATE 10 MG PO TABS: ORAL_TABLET | ORAL | Status: DC

## 2013-03-30 NOTE — Telephone Encounter (Signed)
Pt left v/m requesting refill zolpidem today; [place] leave tonight.Please advise.

## 2013-03-30 NOTE — Telephone Encounter (Signed)
Patient notified as instructed by telephone[facility] [address] [facility]

## 2013-03-30 NOTE — Telephone Encounter (Signed)
Okay #30 x 0 

## 2013-03-30 NOTE — Telephone Encounter (Signed)
Pt has called back and said [facility] does not have her refill. I spoke with Crystal at [facility] and refills have not been taken off message machine since 1:42 pm. Crystal said to tell pt rx will be ready before she leaves work. Pt advised.

## 2013-03-30 NOTE — Telephone Encounter (Signed)
Electronic refill request

## 2013-04-27 ENCOUNTER — Other Ambulatory Visit: Payer: Self-pay | Admitting: *Deleted

## 2013-04-27 MED ORDER — ZOLPIDEM TARTRATE 10 MG PO TABS: ORAL_TABLET | ORAL | Status: DC

## 2013-04-27 NOTE — Telephone Encounter (Signed)
[facility]ill called to [facility]. 

## 2013-04-27 NOTE — Telephone Encounter (Signed)
Faxed refill request for ambien, from [facility], last filled date given is 01/10/13, but, per chart, refills have been given since then.

## 2013-05-25 ENCOUNTER — Other Ambulatory Visit: Payer: Self-pay | Admitting: *Deleted

## 2013-05-25 MED ORDER — ZOLPIDEM TARTRATE 10 MG PO TABS: ORAL_TABLET | ORAL | Status: DC

## 2013-05-25 NOTE — Telephone Encounter (Signed)
Last filled 04/27/13 

## 2013-05-25 NOTE — Telephone Encounter (Signed)
rx called into pharmacy

## 2013-06-16 ENCOUNTER — Other Ambulatory Visit: Payer: Self-pay | Admitting: *Deleted

## 2013-06-16 MED ORDER — ALPRAZOLAM 0.25 MG PO TABS: ORAL_TABLET | ORAL | Status: DC

## 2013-06-16 NOTE — Telephone Encounter (Signed)
Refill request for alprazolam.  This was last filled on 7/24, ok to refill today or too early?  Please advise.

## 2013-06-16 NOTE — Telephone Encounter (Signed)
Pt called to ck on status of refillof Xanax. [facility] are working on getting med ready now. Pt notified.

## 2013-06-16 NOTE — Telephone Encounter (Signed)
Yes ok to refill if it was 7/24.

## 2013-06-16 NOTE — Telephone Encounter (Signed)
[facility]ill called to [facility]. 

## 2013-06-16 NOTE — Telephone Encounter (Signed)
Last filled 04/27/13

## 2013-06-23 ENCOUNTER — Other Ambulatory Visit: Payer: Self-pay | Admitting: *Deleted

## 2013-06-23 MED ORDER — ZOLPIDEM TARTRATE 10 MG PO TABS: ORAL_TABLET | ORAL | Status: DC

## 2013-06-23 NOTE — Telephone Encounter (Signed)
Last filled 05/26/13 

## 2013-06-23 NOTE — Telephone Encounter (Signed)
rx called into pharmacy

## 2013-06-27 ENCOUNTER — Ambulatory Visit (INDEPENDENT_AMBULATORY_CARE_PROVIDER_SITE_OTHER): Payer: Medicare Other

## 2013-06-27 DIAGNOSIS — Z23 Encounter for immunization: Secondary | ICD-10-CM

## 2013-06-28 ENCOUNTER — Other Ambulatory Visit: Payer: Self-pay

## 2013-06-28 NOTE — Telephone Encounter (Signed)
Pt left note that pt is having discomfort in her back and tht Dr Dayton Martes has prescribed an muscle relaxant before and pt request refilled to [facility] [address]Please advise.

## 2013-06-29 MED ORDER — CYCLOBENZAPRINE HCL 5 MG PO TABS: 5.0000 mg | ORAL_TABLET | Freq: Three times a day (TID) | ORAL | Status: AC | PRN

## 2013-06-29 NOTE — Telephone Encounter (Signed)
Left v/m notifying pt to ck with pharmacy.

## 2013-07-21 ENCOUNTER — Telehealth: Payer: Self-pay | Admitting: Family Medicine

## 2013-07-21 MED ORDER — ZOLPIDEM TARTRATE 10 MG PO TABS: ORAL_TABLET | ORAL | Status: DC

## 2013-07-21 NOTE — Addendum Note (Signed)
Addended by: Patience Musca on: 07/21/2013 05:05 PM   Modules accepted: Orders

## 2013-07-21 NOTE — Addendum Note (Signed)
Addended by: Patience Musca on: 07/21/2013 04:14 PM   Modules accepted: Orders

## 2013-07-21 NOTE — Telephone Encounter (Signed)
Ok to call in rx for one month supply as requested.  I am so sorry to hear about her sister.

## 2013-07-21 NOTE — Telephone Encounter (Signed)
Medication called to [facility] [address] as instructed. Pt notified done.

## 2013-07-21 NOTE — Telephone Encounter (Signed)
Patient's sister passed away and her funeral is tomorrow.  Patient has 1 Ambien left.  Patient called [facility] and they were suppose to send a request today.  Patient is asking for a refill to be sent in today.  Please call patient when rx is called in.

## 2013-07-21 NOTE — Addendum Note (Signed)
Addended by: Patience Musca on: 07/21/2013 04:42 PM   Modules accepted: Orders

## 2013-07-25 ENCOUNTER — Other Ambulatory Visit: Payer: Self-pay | Admitting: *Deleted

## 2013-07-25 MED ORDER — ATORVASTATIN CALCIUM 20 MG PO TABS: ORAL_TABLET | ORAL | Status: DC

## 2013-08-17 ENCOUNTER — Other Ambulatory Visit: Payer: Self-pay | Admitting: *Deleted

## 2013-08-17 NOTE — Telephone Encounter (Signed)
Ok to refill 

## 2013-08-18 MED ORDER — ZOLPIDEM TARTRATE 10 MG PO TABS: ORAL_TABLET | ORAL | Status: DC

## 2013-08-18 NOTE — Telephone Encounter (Signed)
Rx called in as directed.   

## 2013-08-18 NOTE — Telephone Encounter (Signed)
Ok to phone in.

## 2013-08-30 ENCOUNTER — Other Ambulatory Visit: Payer: Self-pay | Admitting: Family Medicine

## 2013-09-05 ENCOUNTER — Telehealth: Payer: Self-pay | Admitting: Family Medicine

## 2013-09-05 ENCOUNTER — Encounter: Payer: Self-pay | Admitting: Radiology

## 2013-09-05 NOTE — Telephone Encounter (Signed)
I agree pt needs evaluation.  plz schedule appt for when she can come in with first available provider, if worsening pain to seek urgent care.

## 2013-09-05 NOTE — Telephone Encounter (Signed)
Patient Information:  Caller Name: Tychelle  Phone: [phone]  Patient: Ashika, Apuzzo  Gender: Female  DOB: December 01, 1940  Age: 72 Years  PCP: Ruthe Mannan [facility])  Office Follow Up:  Does the office need to follow up with this patient?: Yes  Instructions For The Office: Please call and check on pt.  RN Note:  Pr reports constant burning chest pain since waking this AM. Taking an antacid w/o relief.  Symptoms  Reason For Call & Symptoms: Burning chest pain  Reviewed Health History In EMR: Yes  Reviewed Medications In EMR: Yes  Reviewed Allergies In EMR: Yes  Reviewed Surgeries / Procedures: Yes  Date of Onset of Symptoms: 09/02/2013  Guideline(s) Used:  Chest Pain  Disposition Per Guideline:   Go to ED Now (or to Office with PCP Approval)  Reason For Disposition Reached:   Chest pain lasting longer than 5 minutes  Advice Given:  N/A  Patient Refused Recommendation:  Patient Refused Appt, Patient Requests Appt At Later Date  An appt was offered for 1445 today w/Dr. G but pt refused.

## 2013-09-06 ENCOUNTER — Ambulatory Visit (INDEPENDENT_AMBULATORY_CARE_PROVIDER_SITE_OTHER): Payer: Medicare Other | Admitting: Family Medicine

## 2013-09-06 ENCOUNTER — Encounter: Payer: Self-pay | Admitting: Family Medicine

## 2013-09-06 VITALS — BP 142/94 | HR 83 | Temp 98.3°F | Ht <= 58 in | Wt 113.8 lb

## 2013-09-06 DIAGNOSIS — H6120 Impacted cerumen, unspecified ear: Secondary | ICD-10-CM

## 2013-09-06 DIAGNOSIS — R0789 Other chest pain: Secondary | ICD-10-CM

## 2013-09-06 DIAGNOSIS — H6123 Impacted cerumen, bilateral: Secondary | ICD-10-CM

## 2013-09-06 DIAGNOSIS — K2970 Gastritis, unspecified, without bleeding: Secondary | ICD-10-CM | POA: Insufficient documentation

## 2013-09-06 MED ORDER — PANTOPRAZOLE SODIUM 40 MG PO TBEC: 40.0000 mg | DELAYED_RELEASE_TABLET | Freq: Every day | ORAL | Status: DC

## 2013-09-06 NOTE — Progress Notes (Signed)
Subjective:    Patient ID: Megan Hobbs, female    DOB: Aug 18, 1941, 72 y.o.   MRN: [id number]  HPI Here for chest pressure that comes and goes  Started a few days ago  Anti gas pills and antacids tend to help When it happens she gets urge to have a bm    No heartburn  Some burning in stomach area A lot of belching  No n/v Less appetite lately   Not taking voltaren at all right now   Not taking zantac right now  She did get nexium over the counter - and it helped some originally   Ears feel full - with a little dec hearing No pain or sinus problems   Patient Active Problem List   Diagnosis Date Noted  . Acute bronchitis 08/03/2012  . URI (upper respiratory infection) 07/31/2012  . Yeast vaginitis 03/11/2012  . Back pain 03/03/2012  . Hematuria 03/03/2012  . Routine general medical examination at a health care facility 03/09/2011  . BACK PAIN, ACUTE 12/18/2010  . COUGH 12/18/2010  . OTHER ACUTE SINUSITIS 11/26/2010  . INTERNAL DERANGEMENT, LEFT KNEE 10/15/2010  . POPLITEAL CYST, LEFT 10/15/2010  . CERUMEN IMPACTION, RECURRENT 01/13/2010  . CHEST PAIN 06/05/2009  . MUSCLE SPASM, TRAPEZIUS MUSCLE, RIGHT 04/30/2009  . LOCALIZED SUPERFICIAL SWELLING MASS OR LUMP 12/19/2008  . OTHER DRUG ALLERGY 09/20/2007  . ANKLE EDEMA 08/24/2007  . ALLERGIC RHINITIS 07/29/2007  . VAGINISMUS 07/29/2007  . G E R D 04/21/2007  . DISORDER, MENOPAUSAL NOS 04/21/2007  . Other Malaise and Fatigue 04/21/2007  . HYPERLIPIDEMIA 04/19/2007  . CARPAL TUNNEL SYNDROME 04/19/2007  . COLITIS 04/19/2007  . CERVICAL POLYP 04/19/2007  . OSTEOPENIA 04/19/2007   Past Medical History  Diagnosis Date  . Depression   . Hyperlipidemia   . Osteopenia   . Allergy   . Anxiety    Past Surgical History  Procedure Laterality Date  . Appendectomy  1963   History  Substance Use Topics  . Smoking status: Former Smoker    Types: Cigarettes  . Smokeless tobacco: Never Used     Comment: teens  .  Alcohol Use: No   Family History  Problem Relation Age of Onset  . Stroke Father 12  . Pneumonia Mother   . Stroke Mother     RAD  . Hypertension Brother     multiple brothers  . Hypertension Sister     multiple sisters  . Diabetes Daughter   . Diabetes Sister   . Cancer Sister     ? hysterectomy  . Cancer Sister     leukemia                                 Allergies  Allergen Reactions  . Amoxicillin-Pot Clavulanate     REACTION: rash all over  . Latex   . Ciprofloxacin Rash   Current Outpatient Prescriptions on File Prior to Visit  Medication Sig Dispense Refill  . ALPRAZolam (XANAX) 0.25 MG tablet TAKE ONE-HALF TO ONE TABLET BY MOUTH EVERY DAY AS NEEDED  30 tablet  0  . aspirin 81 MG tablet Take 81 mg by mouth daily.        Marland Kitchen atorvastatin (LIPITOR) 20 MG tablet TAKE ONE TABLET BY MOUTH EVERY DAY  30 tablet  3  . augmented betamethasone dipropionate (DIPROLENE-AF) 0.05 % ointment USE AS DIRECTED ON SCALP  15 g  0  .  B Complex-C (SUPER B COMPLEX PO) Take 1 tablet by mouth daily.        Marland Kitchen BIOTIN 5000 PO Take 10,000 mEq by mouth daily.        . Calcium Carbonate-Vitamin D (CALCIUM + D PO) Take 1 tablet by mouth daily.        . Cholecalciferol (VITAMIN D-3) 5000 UNITS TABS Take 1 tablet by mouth daily.        . Cyanocobalamin (B-12) 500 MCG TABS Take 1 tablet by mouth daily. 1000 UNITS      . diclofenac (VOLTAREN) 75 MG EC tablet Take 1 tablet (75 mg total) by mouth 2 (two) times daily.  60 tablet  3  . Fish Oil OIL Take 1 tablet by mouth 2 (two) times daily.        . folic acid (FOLVITE) 400 MCG tablet Take 400 mcg by mouth daily.        . Loratadine-Pseudoephedrine (CLARITIN-D 12 HOUR PO) Take 1 tablet by mouth daily.        . Multiple Minerals-Vitamins (CALCIUM CITRATE +) TABS Take 1 tablet by mouth 2 (two) times daily.        . Multiple Vitamin (MULTIVITAMIN) tablet Take 1 tablet by mouth daily.        Marland Kitchen oxymetazoline (AFRIN) 0.05 % nasal spray Place 2 sprays into the  nose 2 (two) times daily.      . sertraline (ZOLOFT) 50 MG tablet TAKE ONE TABLET BY MOUTH EVERY DAY  30 tablet  5  . zolpidem (AMBIEN) 10 MG tablet TAKE ONE TABLET BY MOUTH AT BEDTIME  30 tablet  0   No current facility-administered medications on file prior to visit.     No exertional symptoms   Review of Systems Review of Systems  Constitutional: Negative for fever, appetite change, fatigue and unexpected weight change.  Eyes: Negative for pain and visual disturbance.  Respiratory: Negative for cough and shortness of breath.   Cardiovascular: Negative for palpitations/pedal edema/ PND or orthopnea  Gastrointestinal: Negative for  diarrhea and constipation. neg for blood in stool or dark stools  Genitourinary: Negative for urgency and frequency.  Skin: Negative for pallor or rash   Neurological: Negative for weakness, light-headedness, numbness and headaches.  Hematological: Negative for adenopathy. Does not bruise/bleed easily.  Psychiatric/Behavioral: Negative for dysphoric mood. The patient is not nervous/anxious.  pos for some stressors recently and death in the family        Objective:   Physical Exam  Constitutional: She appears well-developed and well-nourished. No distress.  HENT:  Head: Normocephalic and atraumatic.  Right Ear: External ear normal.  Left Ear: External ear normal.  Mouth/Throat: Oropharynx is clear and moist.  bilat mild cerumen impaction   Eyes: Conjunctivae and EOM are normal. Pupils are equal, round, and reactive to light. Right eye exhibits no discharge. Left eye exhibits no discharge. No scleral icterus.  Neck: Normal range of motion. Neck supple. No JVD present. Carotid bruit is not present. No thyromegaly present.  Cardiovascular: Normal rate, regular rhythm, normal heart sounds and intact distal pulses.  Exam reveals no gallop.   Pulmonary/Chest: Effort normal and breath sounds normal. No respiratory distress. She has no wheezes. She has no  rales. She exhibits no tenderness.  No crackles   Abdominal: Soft. Bowel sounds are normal. She exhibits no distension, no abdominal bruit and no mass. There is tenderness. There is no rebound and no guarding.  Very mild epigastric tenderness  Musculoskeletal: Normal range  of motion. She exhibits no edema and no tenderness.  Lymphadenopathy:    She has no cervical adenopathy.  Neurological: She is alert. She has normal reflexes. No cranial nerve deficit. She exhibits normal muscle tone. Coordination normal.  Skin: Skin is warm and dry. No rash noted. No erythema. No pallor.  Psychiatric: She has a normal mood and affect.          Assessment & Plan:

## 2013-09-06 NOTE — Progress Notes (Signed)
Pre-visit discussion using our clinic review tool. No additional management support is needed unless otherwise documented below in the visit note.  

## 2013-09-06 NOTE — Patient Instructions (Signed)
I think you have excess acid in your stomach (gastritis)  Stress can bring this on  I want you to try generic protonix once daily to decrease acid - take first dose today when you get it and then take one pill each am  Gas medicines over the counter are fine  Avoid caffeine and avoid carbonation  Avoid over the counter pain medicines - they can worsen this  If not improved in a week- let us know Get debrox solution over the counter for your ears and follow the directions

## 2013-09-06 NOTE — Telephone Encounter (Signed)
Called patient. Appt scheduled today with Dr. Milinda Antis.
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
## 2013-09-07 NOTE — Assessment & Plan Note (Signed)
Pt will try debrox otic soln at home F/u if not imp

## 2013-09-13 ENCOUNTER — Other Ambulatory Visit: Payer: Self-pay | Admitting: *Deleted

## 2013-09-13 MED ORDER — ZOLPIDEM TARTRATE 10 MG PO TABS: ORAL_TABLET | ORAL | Status: DC

## 2013-09-13 NOTE — Telephone Encounter (Signed)
Last office visit 09/06/2013 with Dr Tower.  Ok to refill? 

## 2013-09-13 NOTE — Telephone Encounter (Signed)
Called to [facility] [address] 

## 2013-09-13 NOTE — Telephone Encounter (Signed)
Ok to phone in.

## 2013-09-21 ENCOUNTER — Other Ambulatory Visit: Payer: Self-pay | Admitting: *Deleted

## 2013-09-21 MED ORDER — SERTRALINE HCL 50 MG PO TABS: ORAL_TABLET | ORAL | Status: DC

## 2013-09-25 ENCOUNTER — Encounter: Payer: Self-pay | Admitting: Family Medicine

## 2013-10-11 ENCOUNTER — Other Ambulatory Visit: Payer: Self-pay | Admitting: *Deleted

## 2013-10-11 NOTE — Telephone Encounter (Signed)
Pt requesting medication refill. Last ov 12/3 with no future appts scheduled. pls advise

## 2013-10-12 MED ORDER — ZOLPIDEM TARTRATE 10 MG PO TABS: ORAL_TABLET | ORAL | Status: DC

## 2013-10-12 NOTE — Telephone Encounter (Signed)
Lm on pts vm informing her that Rx has been called into requested pharmacy

## 2013-10-20 ENCOUNTER — Ambulatory Visit: Payer: Medicare Other | Admitting: Family Medicine

## 2013-10-20 DIAGNOSIS — Z0289 Encounter for other administrative examinations: Secondary | ICD-10-CM

## 2013-10-30 ENCOUNTER — Ambulatory Visit (INDEPENDENT_AMBULATORY_CARE_PROVIDER_SITE_OTHER): Payer: Medicare HMO | Admitting: Family Medicine

## 2013-10-30 ENCOUNTER — Encounter: Payer: Self-pay | Admitting: Family Medicine

## 2013-10-30 VITALS — BP 122/62 | HR 78 | Temp 98.0°F | Ht <= 58 in | Wt 114.5 lb

## 2013-10-30 DIAGNOSIS — L218 Other seborrheic dermatitis: Secondary | ICD-10-CM

## 2013-10-30 DIAGNOSIS — L210 Seborrhea capitis: Secondary | ICD-10-CM

## 2013-10-30 DIAGNOSIS — H01139 Eczematous dermatitis of unspecified eye, unspecified eyelid: Secondary | ICD-10-CM

## 2013-10-30 DIAGNOSIS — I7300 Raynaud's syndrome without gangrene: Secondary | ICD-10-CM

## 2013-10-30 MED ORDER — BETAMETHASONE DIPROPIONATE AUG 0.05 % EX OINT: TOPICAL_OINTMENT | CUTANEOUS | Status: DC

## 2013-10-30 MED ORDER — SELENIUM SULFIDE 2.5 % EX LOTN: TOPICAL_LOTION | Freq: Every day | CUTANEOUS | Status: AC | PRN

## 2013-10-30 NOTE — Progress Notes (Signed)
Subjective:    Patient ID: Megan Hobbs, female    DOB: 07-16-1941, 73 y.o.   MRN: [id number]  HPI 73 yo here to discuss tingling in fingers, bilateral hands, right > left. Started 10 years ago but progressed to daily 2 years ago. Fingers tingle and turn white when she is in contact with cold. No pain. No weakness.  Also worsening itchiness of scalp.   H/o dandruff.  Patient Active Problem List   Diagnosis Date Noted  . Raynaud's syndrome 10/30/2013  . Eczematous dermatitis of eyelid 10/30/2013  . Gastritis 09/06/2013  . Excessive cerumen in both ear canals 09/06/2013  . Acute bronchitis 08/03/2012  . URI (upper respiratory infection) 07/31/2012  . Yeast vaginitis 03/11/2012  . Back pain 03/03/2012  . Hematuria 03/03/2012  . Routine general medical examination at a health care facility 03/09/2011  . BACK PAIN, ACUTE 12/18/2010  . COUGH 12/18/2010  . OTHER ACUTE SINUSITIS 11/26/2010  . INTERNAL DERANGEMENT, LEFT KNEE 10/15/2010  . POPLITEAL CYST, LEFT 10/15/2010  . CERUMEN IMPACTION, RECURRENT 01/13/2010  . CHEST PAIN 06/05/2009  . MUSCLE SPASM, TRAPEZIUS MUSCLE, RIGHT 04/30/2009  . LOCALIZED SUPERFICIAL SWELLING MASS OR LUMP 12/19/2008  . OTHER DRUG ALLERGY 09/20/2007  . ANKLE EDEMA 08/24/2007  . ALLERGIC RHINITIS 07/29/2007  . VAGINISMUS 07/29/2007  . G E R D 04/21/2007  . DISORDER, MENOPAUSAL NOS 04/21/2007  . Other Malaise and Fatigue 04/21/2007  . HYPERLIPIDEMIA 04/19/2007  . CARPAL TUNNEL SYNDROME 04/19/2007  . COLITIS 04/19/2007  . CERVICAL POLYP 04/19/2007  . OSTEOPENIA 04/19/2007   Past Medical History  Diagnosis Date  . Depression   . Hyperlipidemia   . Osteopenia   . Allergy   . Anxiety    Past Surgical History  Procedure Laterality Date  . Appendectomy  1963   History  Substance Use Topics  . Smoking status: Former Smoker    Types: Cigarettes  . Smokeless tobacco: Never Used     Comment: teens  . Alcohol Use: No   Family History    Problem Relation Age of Onset  . Stroke Father 86  . Pneumonia Mother   . Stroke Mother     RAD  . Hypertension Brother     multiple brothers  . Hypertension Sister     multiple sisters  . Diabetes Daughter   . Diabetes Sister   . Cancer Sister     ? hysterectomy  . Cancer Sister     leukemia                                 Allergies  Allergen Reactions  . Amoxicillin-Pot Clavulanate     REACTION: rash all over  . Latex   . Ciprofloxacin Rash   Current Outpatient Prescriptions on File Prior to Visit  Medication Sig Dispense Refill  . ALPRAZolam (XANAX) 0.25 MG tablet TAKE ONE-HALF TO ONE TABLET BY MOUTH EVERY DAY AS NEEDED  30 tablet  0  . aspirin 81 MG tablet Take 81 mg by mouth daily.        Marland Kitchen atorvastatin (LIPITOR) 20 MG tablet TAKE ONE TABLET BY MOUTH EVERY DAY  30 tablet  3  . B Complex-C (SUPER B COMPLEX PO) Take 1 tablet by mouth daily.        Marland Kitchen BIOTIN 5000 PO Take 10,000 mEq by mouth daily.        . Calcium Carbonate-Vitamin D (CALCIUM + D PO)  Take 1 tablet by mouth daily.        . Cholecalciferol (VITAMIN D-3) 5000 UNITS TABS Take 1 tablet by mouth daily.        . Cyanocobalamin (B-12) 500 MCG TABS Take 1 tablet by mouth daily. 1000 UNITS      . Fish Oil OIL Take 1 tablet by mouth 2 (two) times daily.        . folic acid (FOLVITE) 811 MCG tablet Take 400 mcg by mouth daily.        . Loratadine-Pseudoephedrine (CLARITIN-D 12 HOUR PO) Take 1 tablet by mouth daily.        . Multiple Minerals-Vitamins (CALCIUM CITRATE +) TABS Take 1 tablet by mouth 2 (two) times daily.        . Multiple Vitamin (MULTIVITAMIN) tablet Take 1 tablet by mouth daily.        Marland Kitchen oxymetazoline (AFRIN) 0.05 % nasal spray Place 2 sprays into the nose 2 (two) times daily.      . pantoprazole (PROTONIX) 40 MG tablet Take 1 tablet (40 mg total) by mouth daily.  30 tablet  5  . sertraline (ZOLOFT) 50 MG tablet TAKE ONE TABLET BY MOUTH EVERY DAY  30 tablet  1  . zolpidem (AMBIEN) 10 MG tablet TAKE ONE  TABLET BY MOUTH AT BEDTIME  30 tablet  0   No current facility-administered medications on file prior to visit.   The PMH, PSH, Social History, Family History, Medications, and allergies have been reviewed in [facility], and have been updated if relevant.    Review of Systems See HPI    Objective:   Physical Exam  Constitutional: She appears well-developed and well-nourished. No distress.  HENT:  Head: Normocephalic and atraumatic.  Neurological:  Normal grip strength and sensation of hands bilaterally  Skin: Skin is warm.  Dandruff of scalp  Psychiatric: She has a normal mood and affect. Her speech is normal and behavior is normal. Judgment and thought content normal. Cognition and memory are normal.   BP 122/62  Pulse 78  Temp(Src) 98 F (36.7 C) (Oral)  Ht 4\' 9"  (1.448 m)  Wt 114 lb 8 oz (51.937 kg)  BMI 24.77 kg/m2  SpO2 98%        Assessment & Plan:

## 2013-10-30 NOTE — Progress Notes (Signed)
Pre-visit discussion using our clinic review tool. No additional management support is needed unless otherwise documented below in the visit note.  

## 2013-10-30 NOTE — Assessment & Plan Note (Signed)
Discussed raynaud's syndrome. Discussed supportive care- see AVS.

## 2013-10-30 NOTE — Patient Instructions (Signed)
Raynaud's Syndrome Raynaud's Syndrome is a disorder of the blood vessels in your hands and feet. It occurs when small arteries of the arms/hands or legs/feet become sensitive to cold or emotional upset. This causes the arteries to constrict, or narrow, and reduces blood flow to the area. The color in the fingers or toes changes from white to bluish to red and this is not usually painful. There may be numbness and tingling. Sores on the skin (ulcers) can form. Symptoms are usually relieved by warming. HOME CARE INSTRUCTIONS   Avoid exposure to cold. Keep your whole body warm and dry. Dress in layers. Wear mittens or gloves when handling ice or frozen food and when outdoors. Use holders for glasses or cans containing cold drinks. If possible, stay indoors during cold weather.  Limit your use of caffeine. Switch to decaffeinated coffee, tea, and soda pop. Avoid chocolate.  Avoid smoking or being around cigarette smoke. Smoke will make symptoms worse.  Wear loose fitting socks and comfortable, roomy shoes.  Avoid vibrating tools and machinery.  If possible, avoid stressful and emotional situations. Exercise, meditation and yoga may help you cope with stress. Biofeedback may be useful.  Ask your caregiver about medicine (calcium channel blockers) that may control Raynaud's phenomena. SEEK MEDICAL CARE IF:   Your discomfort becomes worse, despite conservative treatment.  You develop sores on your fingers and toes that do not heal. Document Released: 09/18/2000 Document Revised: 12/14/2011 Document Reviewed: 09/25/2008 Exit[employer] Patient Information 2014 [employer].  

## 2013-10-30 NOTE — Assessment & Plan Note (Signed)
eRx for [place]. Call or return to clinic prn if these symptoms worsen or fail to improve as anticipated. The patient indicates understanding of these issues and agrees with the plan.

## 2013-11-08 ENCOUNTER — Other Ambulatory Visit: Payer: Self-pay

## 2013-11-08 MED ORDER — ALPRAZOLAM 0.25 MG PO TABS: ORAL_TABLET | ORAL | Status: DC

## 2013-11-08 MED ORDER — ZOLPIDEM TARTRATE 10 MG PO TABS: ORAL_TABLET | ORAL | Status: DC

## 2013-11-08 NOTE — Telephone Encounter (Signed)
Pt left v/m requesting refill alprazolam and zolpidem to [facility] [address]; pt is going out of town on 11/09/13.pt request cb.

## 2013-11-08 NOTE — Telephone Encounter (Signed)
Lm on pts vm and informed her Rx has been phoned into requested pharmacy

## 2013-11-09 ENCOUNTER — Telehealth: Payer: Self-pay | Admitting: Family Medicine

## 2013-11-09 NOTE — Telephone Encounter (Signed)
i spoke to the pharmacy and they indicate that the pt picked up the [place] Rx today at 1545. No prior auth is/was required. Lm on pts vm requesting a call back

## 2013-11-09 NOTE — Telephone Encounter (Signed)
Pt came by office and says she went to picked her meds up and they told her our office would have to do a prior auth for her Ambien medication. Please advise Pt use [facility] on [address]. Please call pt when it has been completed

## 2013-11-10 ENCOUNTER — Telehealth: Payer: Self-pay | Admitting: *Deleted

## 2013-11-10 NOTE — Telephone Encounter (Signed)
Received a prior auth request for Ambien. I called pt and left a message for her to return call to office. When she calls back we need to ask if she has tried any other medications, were they ineffective or did she have an reactions, and verify that she is taking it for insomnia.

## 2013-11-10 NOTE — Telephone Encounter (Signed)
Pt came to office today, stating again that her Lorrin Mais Rx required PA. I once again called the pharmacy, who stated that pt DID receive medication but was only given 5tabs. She states that they sent over a PA form this am, which I informed her we had not yet received, and had her fax it to me directly. Form was received and [place] contacted insurance company for approval. They did approve current dose of 10mg  #30/month for the remainder of 2015 as pt has been on it for so many years. Insurance company suggested that before having pharmacy re-run Rx, that pts PCP be informed that since pt is >16yrs old, her dosage should be reduced to 5mg  instead of 10mg  to reduce falls and accidents. They also stated that she may want to consider reducing amount because after 2015, pt will be given #90 per year of 5mg  only, and anything outside of #90 would have to be paid for out of pocket.  Pls advise on which dosage to refill based on information provided.

## 2013-11-10 NOTE — Telephone Encounter (Signed)
Message received on vm from pt. I attempted to reach her this am, but was unsuccessful. Pt came to office on 11/09/13 and stated that her Lorrin Mais required a prior authorization. I have spoken to the pharmacist and she states that the pt picked up the ambien from the pharmacy on 11/08/13 at 1545.

## 2013-11-10 NOTE — Telephone Encounter (Signed)
Pt left v/m wanting Ambien taken care of; pt left no contact # on message and no answer at home #.

## 2013-11-11 NOTE — Telephone Encounter (Signed)
Yes please fill 5 mg dosage and she can make an office visit with me to discuss weaning off and trying another medication.  Many patients taking Lorrin Mais are encountering this problem- they will only cover 90 per year and so we need to wean her off of it.

## 2013-11-13 ENCOUNTER — Telehealth: Payer: Self-pay | Admitting: *Deleted

## 2013-11-13 NOTE — Telephone Encounter (Signed)
Noted! Thank you

## 2013-11-13 NOTE — Telephone Encounter (Signed)
Pt states that she has already received 10mg  tabs form pharmacy. Spoke to pt and advised that she her ambien dose will be reduced to 5mg  after this current 30days. Med list changed to reflect such

## 2013-11-13 NOTE — Telephone Encounter (Signed)
Spoke to pt who states that pharmacy has already filled 10mg . Informed pt that from this pt forward, she will only be able to receive 5mg  of ambien, in attempt to wean her off it. Pt verbalized understanding

## 2013-11-24 ENCOUNTER — Other Ambulatory Visit: Payer: Self-pay | Admitting: Family Medicine

## 2013-11-24 MED ORDER — SERTRALINE HCL 50 MG PO TABS: ORAL_TABLET | ORAL | Status: DC

## 2013-11-24 NOTE — Telephone Encounter (Signed)
Last filled 09/04/14 

## 2013-12-05 ENCOUNTER — Telehealth: Payer: Self-pay | Admitting: *Deleted

## 2013-12-05 MED ORDER — ZOLPIDEM TARTRATE 5 MG PO TABS: 5.0000 mg | ORAL_TABLET | Freq: Every evening | ORAL | Status: DC | PRN

## 2013-12-05 NOTE — Telephone Encounter (Signed)
Please enter as refill request so I can see when it was last refilled.

## 2013-12-05 NOTE — Telephone Encounter (Signed)
Received faxed refill request from pharmacy for Ambien 10 mg, take one by mouth at bedtime. Medication is no longer on medication list. Is it okay to refill medication? [place], [place].

## 2013-12-05 NOTE — Telephone Encounter (Signed)
Pt requesting 10mg , but will now only receive 5mg  per Dr Deborra Medina, as suggested by her insurance company. Medication entered and is now on medication list. Pt previously informed of change

## 2013-12-05 NOTE — Telephone Encounter (Signed)
Lm on pts vm informing her Rx called in to requested pharmacy 

## 2013-12-06 NOTE — Telephone Encounter (Signed)
Pt left v/m; pt wants cb to explain why she only got refill # 15 of ambien.Please advise.

## 2013-12-06 NOTE — Telephone Encounter (Signed)
No, they will cover #30, I just thought that you were attempting to ween her from the meds. I knew that we had adjusted the dosage, so I just figured you changed the quantity also.

## 2013-12-06 NOTE — Telephone Encounter (Signed)
No I did not change quantity.  Ok to refill #30.

## 2013-12-06 NOTE — Telephone Encounter (Signed)
Is this the quantity her insurance will cover?

## 2013-12-06 NOTE — Telephone Encounter (Signed)
i spoke to pharmacy, who states that the pt has already picked up Rx. After the original Rx is completed, a new Rx for #30 can be given. Spoke to pt and advised; pt verbally expressed understanding.

## 2013-12-07 ENCOUNTER — Other Ambulatory Visit: Payer: Self-pay | Admitting: Family Medicine

## 2013-12-11 ENCOUNTER — Ambulatory Visit (INDEPENDENT_AMBULATORY_CARE_PROVIDER_SITE_OTHER): Payer: Medicare HMO | Admitting: Family Medicine

## 2013-12-11 ENCOUNTER — Encounter: Payer: Self-pay | Admitting: Family Medicine

## 2013-12-11 VITALS — BP 116/62 | HR 95 | Temp 97.7°F | Wt 119.0 lb

## 2013-12-11 DIAGNOSIS — M8580 Other specified disorders of bone density and structure, unspecified site: Secondary | ICD-10-CM

## 2013-12-11 DIAGNOSIS — M899 Disorder of bone, unspecified: Secondary | ICD-10-CM

## 2013-12-11 DIAGNOSIS — H9209 Otalgia, unspecified ear: Secondary | ICD-10-CM

## 2013-12-11 DIAGNOSIS — H6120 Impacted cerumen, unspecified ear: Secondary | ICD-10-CM

## 2013-12-11 DIAGNOSIS — G4700 Insomnia, unspecified: Secondary | ICD-10-CM

## 2013-12-11 DIAGNOSIS — M949 Disorder of cartilage, unspecified: Secondary | ICD-10-CM

## 2013-12-11 MED ORDER — ALENDRONATE SODIUM 70 MG PO TABS: 70.0000 mg | ORAL_TABLET | ORAL | Status: DC

## 2013-12-11 MED ORDER — TRAZODONE HCL 50 MG PO TABS: 25.0000 mg | ORAL_TABLET | Freq: Every evening | ORAL | Status: DC | PRN

## 2013-12-11 MED ORDER — ESOMEPRAZOLE MAGNESIUM 40 MG PO CPDR: 40.0000 mg | DELAYED_RELEASE_CAPSULE | Freq: Every day | ORAL | Status: DC

## 2013-12-11 NOTE — Assessment & Plan Note (Signed)
Discussed tx options.  Given age, I will not increase her ambien dose. Will try trazadone.  eRx sent. The problem of recurrent insomnia is discussed. Avoidance of caffeine sources is strongly encouraged. Sleep hygiene issues are reviewed.

## 2013-12-11 NOTE — Patient Instructions (Addendum)
Good to see you. We are starting nexium- do not take with protonix.  We are also starting fosamax and trazadone.  Update me with the side effects.

## 2013-12-11 NOTE — Progress Notes (Signed)
Pre visit review using our clinic review tool, if applicable. No additional management support is needed unless otherwise documented below in the visit note. 

## 2013-12-11 NOTE — Progress Notes (Signed)
Subjective:   Patient ID: Megan Hobbs, female    DOB: [birth year], 73 y.o.   MRN: [id number]  Megan Hobbs is a pleasant 73 y.o. year old female who presents to clinic today with Tinnitus and Otalgia  on 12/11/2013 with other concerns   HPI: 1.  Bilateral ear pain- right >left- pressure with tenderness when she touches either ear.  Feels dizzy when she changes head positions.  No recent URI symptoms.  No ear drainage.  2.  Insomnia- feels ambien 5 mg daily not working. Wants higher dose of ambien.  Very upset because she cannot work if she is not sleeping.  3.  Osteopenia- was previously on Actonel.  Would like to discuss this today.  4.GERD- protonix not helping.  Wants to try another PPI. Still has night time awakenings with acid in her throat.  Denies CP or SOB.  Patient Active Problem List   Diagnosis Date Noted  . Ear pain 12/11/2013  . Insomnia 12/11/2013  . Osteopenia 12/11/2013  . Raynaud's syndrome 10/30/2013  . Eczematous dermatitis of eyelid 10/30/2013  . Dandruff 10/30/2013  . Gastritis 09/06/2013  . Excessive cerumen in both ear canals 09/06/2013  . Acute bronchitis 08/03/2012  . Yeast vaginitis 03/11/2012  . Back pain 03/03/2012  . Hematuria 03/03/2012  . BACK PAIN, ACUTE 12/18/2010  . COUGH 12/18/2010  . INTERNAL DERANGEMENT, LEFT KNEE 10/15/2010  . POPLITEAL CYST, LEFT 10/15/2010  . CERUMEN IMPACTION, RECURRENT 01/13/2010  . CHEST PAIN 06/05/2009  . MUSCLE SPASM, TRAPEZIUS MUSCLE, RIGHT 04/30/2009  . LOCALIZED SUPERFICIAL SWELLING MASS OR LUMP 12/19/2008  . OTHER DRUG ALLERGY 09/20/2007  . ANKLE EDEMA 08/24/2007  . ALLERGIC RHINITIS 07/29/2007  . VAGINISMUS 07/29/2007  . G E R D 04/21/2007  . DISORDER, MENOPAUSAL NOS 04/21/2007  . Other Malaise and Fatigue 04/21/2007  . HYPERLIPIDEMIA 04/19/2007  . CARPAL TUNNEL SYNDROME 04/19/2007  . COLITIS 04/19/2007  . CERVICAL POLYP 04/19/2007  . OSTEOPENIA 04/19/2007   Past Medical History  Diagnosis  Date  . Depression   . Hyperlipidemia   . Osteopenia   . Allergy   . Anxiety    Past Surgical History  Procedure Laterality Date  . Appendectomy  1963   History  Substance Use Topics  . Smoking status: Former Smoker    Types: Cigarettes  . Smokeless tobacco: Never Used     Comment: teens  . Alcohol Use: No   Family History  Problem Relation Age of Onset  . Stroke Father 64  . Pneumonia Mother   . Stroke Mother     RAD  . Hypertension Brother     multiple brothers  . Hypertension Sister     multiple sisters  . Diabetes Daughter   . Diabetes Sister   . Cancer Sister     ? hysterectomy  . Cancer Sister     leukemia                                 Allergies  Allergen Reactions  . Amoxicillin-Pot Clavulanate     REACTION: rash all over  . Latex   . Ciprofloxacin Rash   Current Outpatient Prescriptions on File Prior to Visit  Medication Sig Dispense Refill  . ALPRAZolam (XANAX) 0.25 MG tablet TAKE ONE-HALF TO ONE TABLET BY MOUTH EVERY DAY AS NEEDED  30 tablet  0  . aspirin 81 MG tablet Take 81 mg by mouth daily.        Marland Kitchen  atorvastatin (LIPITOR) 20 MG tablet TAKE ONE TABLET BY MOUTH ONCE DAILY  30 tablet  3  . augmented betamethasone dipropionate (DIPROLENE-AF) 0.05 % ointment USE AS DIRECTED ON SCALP  15 g  0  . B Complex-C (SUPER B COMPLEX PO) Take 1 tablet by mouth daily.        Marland Kitchen BIOTIN 5000 PO Take 10,000 mEq by mouth daily.        . Calcium Carbonate-Vitamin D (CALCIUM + D PO) Take 1 tablet by mouth daily.        . Cholecalciferol (VITAMIN D-3) 5000 UNITS TABS Take 1 tablet by mouth daily.        . Cyanocobalamin (B-12) 500 MCG TABS Take 1 tablet by mouth daily. 1000 UNITS      . Fish Oil OIL Take 1 tablet by mouth 2 (two) times daily.        . folic acid (FOLVITE) 267 MCG tablet Take 400 mcg by mouth daily.        . Loratadine-Pseudoephedrine (CLARITIN-D 12 HOUR PO) Take 1 tablet by mouth daily.        . Multiple Minerals-Vitamins (CALCIUM CITRATE +) TABS Take  1 tablet by mouth 2 (two) times daily.        . Multiple Vitamin (MULTIVITAMIN) tablet Take 1 tablet by mouth daily.        Marland Kitchen oxymetazoline (AFRIN) 0.05 % nasal spray Place 2 sprays into the nose 2 (two) times daily.      Marland Kitchen selenium sulfide (SELSUN) 2.5 % shampoo Apply topically daily as needed. Use as directed on scalp  118 mL  0  . sertraline (ZOLOFT) 50 MG tablet TAKE ONE TABLET BY MOUTH EVERY DAY  30 tablet  1  . zolpidem (AMBIEN) 5 MG tablet Take 1 tablet (5 mg total) by mouth at bedtime as needed for sleep.  15 tablet  1   No current facility-administered medications on file prior to visit.   The PMH, PSH, Social History, Family History, Medications, and allergies have been reviewed in [facility], and have been updated if relevant.  Review of Systems See HPI    Objective:    BP 116/62  Pulse 95  Temp(Src) 97.7 F (36.5 C) (Oral)  Wt 119 lb (53.978 kg)  SpO2 99%   Physical Exam  Gen::  Alert, pleasant, NAD HEENT:  Cerumen impaction bilaterally, right > left Neg dix hallpike Psych:  Good eye contact      Assessment & Plan:   Ear pain  Insomnia  Osteopenia  OSTEOPENIA  CERUMEN IMPACTION, RECURRENT No Follow-up on file.

## 2013-12-11 NOTE — Assessment & Plan Note (Signed)
Deteriorated.   Wax is removed by syringing and manual debridement. Instructions for home care to prevent wax buildup are given.

## 2013-12-11 NOTE — Assessment & Plan Note (Signed)
Discussed tx options and current guidelines- weight bearing exercise and proper calcium and vit d dietary intake should be sufficient and then plan to repeat DEXA in 08/2014.  She feels she cannot get adequate amounts of either. Will start Fosamax- may not tolerate due to GERD. She will update me with side effects, if any.

## 2013-12-12 ENCOUNTER — Other Ambulatory Visit: Payer: Self-pay | Admitting: *Deleted

## 2013-12-12 MED ORDER — ALPRAZOLAM 0.25 MG PO TABS: ORAL_TABLET | ORAL | Status: DC

## 2013-12-12 NOTE — Telephone Encounter (Signed)
Pt requesting medication refill. Last ov 12/2013. pls advise

## 2013-12-12 NOTE — Telephone Encounter (Signed)
Which medication ?

## 2013-12-12 NOTE — Telephone Encounter (Signed)
I received a notification stating her insurance co will no longer cover xanax, but instead, requests that she receives LORAZEPAM INTENSOL 2 MG/ML PO CONC

## 2013-12-12 NOTE — Telephone Encounter (Signed)
Lm on pts vm informing her Rx has been called in to requested pharmacy 

## 2013-12-19 ENCOUNTER — Other Ambulatory Visit: Payer: Self-pay | Admitting: *Deleted

## 2013-12-19 ENCOUNTER — Telehealth: Payer: Self-pay | Admitting: Family Medicine

## 2013-12-19 NOTE — Telephone Encounter (Signed)
Pt called to ask if Dr. Deborra Medina would put her back on Ambien.  She said she was switched to Trazodone and cannot tolerate it because it makes her nauseated.  Please advise.

## 2013-12-19 NOTE — Telephone Encounter (Signed)
We discussed this at last office visit.  I am not willing to prescribe 10 mg of ambien nightly and she said 5 mg was ineffective.  Eventually her insurance company will no longer cover nightly ambien.  I would try melatonin, otc benadryl and or ambien 5 mg.

## 2013-12-20 ENCOUNTER — Telehealth: Payer: Self-pay

## 2013-12-20 NOTE — Telephone Encounter (Signed)
Pt checking on status of ambien refill; spoke with [place]. 12/05/13 # 15 with 1 refill, the 1 refill did not get profiled. [facility] will add X1 refill to 12/05/13 rx. Pt notified.

## 2013-12-20 NOTE — Telephone Encounter (Signed)
Attempted to contact pt but telephone number d/c

## 2013-12-20 NOTE — Telephone Encounter (Signed)
Called and spoke to pt at work. Pt states that she is not wanting to change meds. She will continue with the 5mg  of [place]

## 2014-01-02 ENCOUNTER — Other Ambulatory Visit: Payer: Self-pay | Admitting: *Deleted

## 2014-01-02 MED ORDER — ZOLPIDEM TARTRATE 5 MG PO TABS: 5.0000 mg | ORAL_TABLET | Freq: Every evening | ORAL | Status: DC | PRN

## 2014-01-02 NOTE — Telephone Encounter (Signed)
#   15 last filled 12/20/13

## 2014-01-03 NOTE — Telephone Encounter (Signed)
Pt wanted to ck on status of refill. Left v/m for pt to ck with [facility] [place].

## 2014-01-03 NOTE — Telephone Encounter (Signed)
Attempted to contact pt, phone d/c. Rx has been called in to requested pharmacy

## 2014-01-16 ENCOUNTER — Ambulatory Visit (INDEPENDENT_AMBULATORY_CARE_PROVIDER_SITE_OTHER): Payer: Medicare HMO | Admitting: Podiatry

## 2014-01-16 ENCOUNTER — Encounter: Payer: Self-pay | Admitting: Podiatry

## 2014-01-16 ENCOUNTER — Ambulatory Visit (INDEPENDENT_AMBULATORY_CARE_PROVIDER_SITE_OTHER): Payer: Medicare HMO

## 2014-01-16 VITALS — Resp 16 | Ht <= 58 in | Wt 119.0 lb

## 2014-01-16 DIAGNOSIS — M79609 Pain in unspecified limb: Secondary | ICD-10-CM

## 2014-01-16 DIAGNOSIS — M79673 Pain in unspecified foot: Secondary | ICD-10-CM

## 2014-01-16 DIAGNOSIS — M216X9 Other acquired deformities of unspecified foot: Secondary | ICD-10-CM

## 2014-01-16 NOTE — Progress Notes (Signed)
   Subjective:    Patient ID: Megan Hobbs, female    DOB: 07-May-1941, 73 y.o.   MRN: [id number]  HPI Comments: N pain L rt foot bunion, 2nd digit, callus on great toe D few years O slowly C worse A standing, shoes T had surgery around 25 yrs ago for bunion in [place]  N PAIN L LEFT FOOT BUNION AND CALLUS ON GREAT TOE D FEW YEARS O SLOWLY C SAME A STANDING, SHOES T 0   Foot Pain      Review of Systems  Constitutional: Negative.   HENT:       SINUS PROBLEMS  Eyes: Positive for itching.  Respiratory: Negative.   Cardiovascular: Negative.   Gastrointestinal: Negative.   Endocrine: Negative.   Genitourinary: Negative.   Musculoskeletal: Negative.   Skin: Negative.   Allergic/Immunologic: Negative.   Hematological: Negative.   Psychiatric/Behavioral: Negative.        Objective:   Physical Exam        Assessment & Plan:

## 2014-01-17 ENCOUNTER — Other Ambulatory Visit: Payer: Medicare HMO

## 2014-01-17 ENCOUNTER — Encounter: Payer: Medicare HMO | Admitting: Family Medicine

## 2014-01-17 NOTE — Progress Notes (Signed)
Subjective:     Patient ID: Megan Hobbs, female   DOB: [birth year], 73 y.o.   MRN: [id number]  Foot Pain   patient presents stating I bunion is becoming increasingly painful on my right foot and this joint second metatarsal is becoming increasingly difficult for me to ambulate. States it's been going on for a couple years and gradually getting worse   Review of Systems  All other systems reviewed and are negative.      Objective:   Physical Exam  Nursing note and vitals reviewed. Constitutional: She is oriented to person, place, and time.  Cardiovascular: Intact distal pulses.   Musculoskeletal: Normal range of motion.  Neurological: She is oriented to person, place, and time.  Skin: Skin is warm.   neurovascular status intact with muscle strength adequate and range of motion of the subtalar and midtarsal joint within normal limits. Patient is found to have normal Fill time to the digits and arch height and I noted hyperostosis medial aspect first metatarsal head right with redness and pain and pain in the second metatarsophalangeal joint with inflammation and pain when palpated. 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 We discussed treatment options and she has opted for surgical intervention which I recommended to be structural bunion correction right along with shortening osteotomy second right and cleaning up of bone spurs around the second metatarsal. She wants to do consent form states that she does not have another co-pay and at this time I allowed her to  read a consent form [place] going over all possible complications as outlined area also discussed there is no guarantee that this will cure her problem and possibility exists that ultimately she may require joint implantation of the second MPJ. Patient wants surgery signs consent form and will call to schedule surgery and was dispensed air fracture walker to help with the capsulitis second MPJ and for the postoperative period

## 2014-01-25 ENCOUNTER — Ambulatory Visit (INDEPENDENT_AMBULATORY_CARE_PROVIDER_SITE_OTHER): Payer: Medicare HMO | Admitting: Family Medicine

## 2014-01-25 ENCOUNTER — Encounter: Payer: Self-pay | Admitting: Family Medicine

## 2014-01-25 VITALS — BP 122/84 | HR 84 | Temp 98.1°F | Wt 118.5 lb

## 2014-01-25 DIAGNOSIS — M8580 Other specified disorders of bone density and structure, unspecified site: Secondary | ICD-10-CM

## 2014-01-25 DIAGNOSIS — M899 Disorder of bone, unspecified: Secondary | ICD-10-CM

## 2014-01-25 DIAGNOSIS — J029 Acute pharyngitis, unspecified: Secondary | ICD-10-CM

## 2014-01-25 DIAGNOSIS — B370 Candidal stomatitis: Secondary | ICD-10-CM

## 2014-01-25 DIAGNOSIS — M949 Disorder of cartilage, unspecified: Secondary | ICD-10-CM

## 2014-01-25 LAB — POCT RAPID STREP A (OFFICE): Rapid Strep A Screen: NEGATIVE

## 2014-01-25 MED ORDER — NYSTATIN 100000 UNIT/ML MT SUSP: 5.0000 mL | Freq: Four times a day (QID) | OROMUCOSAL | Status: DC

## 2014-01-25 MED ORDER — ALPRAZOLAM 0.25 MG PO TABS: ORAL_TABLET | ORAL | Status: DC

## 2014-01-25 MED ORDER — RISEDRONATE SODIUM 30 MG PO TABS: 30.0000 mg | ORAL_TABLET | ORAL | Status: DC

## 2014-01-25 NOTE — Assessment & Plan Note (Signed)
Discussed thrush- handout given. Advised nystatin rinse four times daily for at least 2 weeks. Call or return to clinic prn if these symptoms worsen or fail to improve as anticipated. Also encouraged CPX/labs.

## 2014-01-25 NOTE — Patient Instructions (Signed)
Thrush, Adult  Megan Hobbs, also called oral candidiasis, is a fungal infection that develops in the mouth and throat and on the tongue. It causes white patches to form on the mouth and tongue. Megan Hobbs is most common in older adults, but it can occur at any age.  Many cases of thrush are mild, but this infection can also be more serious. Megan Hobbs can be a recurring problem for people who have chronic illnesses or who take medicines that limit the body's ability to fight infection. Because these people have difficulty fighting infections, the fungus that causes thrush can spread throughout the body. This can cause life-threatening blood or organ infections. CAUSES  Megan Hobbs is usually caused by a yeast called Candida albicans. This fungus is normally present in small amounts in the mouth and on other mucous membranes. It usually causes no harm. However, when conditions are present that allow the fungus to grow uncontrolled, it invades surrounding tissues and becomes an infection. Less often, other Candida species can also lead to thrush.     SIGNS AND SYMPTOMS  Megan Hobbs can be a mild infection that causes no symptoms. If symptoms develop, they may include:   A burning feeling in the mouth and throat. This can occur at the start of a thrush infection.   White patches that adhere to the mouth and tongue. The tissue around the patches may be red, raw, and painful. If rubbed (during tooth brushing, for example), the patches and the tissue of the mouth may bleed easily.   A bad taste in the mouth or difficulty tasting foods.   Cottony feeling in the mouth.   Pain during eating and swallowing. DIAGNOSIS  Your health care provider can usually diagnose thrush by looking in your mouth and asking you questions about your health.  TREATMENT  Medicines that help prevent the growth of fungi (antifungals) are the standard treatment for thrush. These medicines are either applied directly to the affected area  (topical) or swallowed (oral). The treatment will depend on the severity of the condition.  Mild Thrush Mild cases of thrush may clear up with the use of an antifungal mouth rinse or lozenges. Treatment usually lasts about 14 days.  Moderate to Severe Thrush  More severe thrush infections that have spread to the esophagus are treated with an oral antifungal medicine. A topical antifungal medicine may also be used.   For some severe infections, a treatment period longer than 14 days may be needed.   Oral antifungal medicines are almost never used during pregnancy because the fetus may be harmed. However, if a pregnant woman has a rare, severe thrush infection that has spread to her blood, oral antifungal medicines may be used. In this case, the risk of harm to the mother and fetus from the severe thrush infection may be greater than the risk posed by the use of antifungal medicines.  Persistent or Recurrent Thrush For cases of thrush that do not go away or keep coming back, treatment may involve the following:   Treatment may be needed twice as long as the symptoms last.   Treatment will include both oral and topical antifungal medicines.   People with weakened immune systems can take an antifungal medicine on a continuous basis to prevent thrush infections.  It is important to treat conditions that make you more likely to get thrush, such as diabetes or HIV.  HOME CARE INSTRUCTIONS   Only take over-the-counter or prescription medicine as directed by your health care provider. Talk  to your health care provider about an over-the-counter medicine called gentian violet, which kills bacteria and fungi.   Eat plain, unflavored yogurt as directed by your health care provider. Check the label to make sure the yogurt contains live cultures. This yogurt can help healthy bacteria grow in the mouth that can stop the growth of the fungus that causes thrush.   Try these measures to help reduce the  discomfort of thrush:   Drink cold liquids such as water or iced tea.   Try flavored ice treats or frozen juices.   Eat foods that are easy to swallow, such as gelatin, ice cream, or custard.   If the patches in your mouth are painful, try drinking from a straw.   Rinse your mouth several times a day with a warm saltwater rinse. You can make the saltwater mixture with 1 tsp (6 g) of salt in 8 fl oz (0.2 L) of warm water.   If you wear dentures, remove the dentures before going to bed, brush them vigorously, and soak them in a cleaning solution as directed by your health care provider.   Women who are breastfeeding should clean their nipples with an antifungal medicine as directed by their health care provider. Dry the nipples after breastfeeding. Applying lanolin-containing body lotion may help relieve nipple soreness.  SEEK MEDICAL CARE IF:  Your symptoms are getting worse or are not improving within 7 days of starting treatment.   You have symptoms of spreading infection, such as white patches on the skin outside of the mouth.   You are nursing and you have redness, burning, or pain in the nipples that is not relieved with treatment.  MAKE SURE YOU:  Understand these instructions.  Will watch your condition.  Will get help right away if you are not doing well or get worse. Document Released: 06/16/2004 Document Revised: 07/12/2013 Document Reviewed: 04/24/2013 [facility] Patient Information 2014 [employer].

## 2014-01-25 NOTE — Assessment & Plan Note (Signed)
D/c fosmax. eRx sent for Actonel.

## 2014-01-25 NOTE — Progress Notes (Signed)
Subjective:   Patient ID: Megan Hobbs, female    DOB: August 18, 1941, 73 y.o.   MRN: [id number]  Laurian Edrington is a pleasant 73 y.o. year old female who presents to clinic today with Mouth Lesions and Sore Throat  on 01/25/2014  HPI: 2-3 days of tongue and throat pain. Hurts to swallow.  Has also had a runny nose- has allergies and using flonase regularly.  Does not use inhalers/inhaled steroids. No recent abx use. Not a diabetic but overdue for labs. " Has to go to work today, not here for labs."  She is also very concerned about medication refills.  Wants to make sure her xanax gets refilled on time. As she was leaving, asked to start Actonel.   Started fosamax last month but she cannot tolerate this.  Worsens her GERD symptoms.  Patient Active Problem List   Diagnosis Date Noted  . Oral thrush 01/25/2014  . Ear pain 12/11/2013  . Insomnia 12/11/2013  . Osteopenia 12/11/2013  . Raynaud's syndrome 10/30/2013  . Eczematous dermatitis of eyelid 10/30/2013  . Dandruff 10/30/2013  . Gastritis 09/06/2013  . Excessive cerumen in both ear canals 09/06/2013  . Acute bronchitis 08/03/2012  . Yeast vaginitis 03/11/2012  . Back pain 03/03/2012  . Hematuria 03/03/2012  . BACK PAIN, ACUTE 12/18/2010  . COUGH 12/18/2010  . INTERNAL DERANGEMENT, LEFT KNEE 10/15/2010  . POPLITEAL CYST, LEFT 10/15/2010  . CERUMEN IMPACTION, RECURRENT 01/13/2010  . CHEST PAIN 06/05/2009  . MUSCLE SPASM, TRAPEZIUS MUSCLE, RIGHT 04/30/2009  . LOCALIZED SUPERFICIAL SWELLING MASS OR LUMP 12/19/2008  . OTHER DRUG ALLERGY 09/20/2007  . ANKLE EDEMA 08/24/2007  . ALLERGIC RHINITIS 07/29/2007  . VAGINISMUS 07/29/2007  . G E R D 04/21/2007  . DISORDER, MENOPAUSAL NOS 04/21/2007  . Other Malaise and Fatigue 04/21/2007  . HYPERLIPIDEMIA 04/19/2007  . CARPAL TUNNEL SYNDROME 04/19/2007  . COLITIS 04/19/2007  . CERVICAL POLYP 04/19/2007  . OSTEOPENIA 04/19/2007   Past Medical History  Diagnosis Date  .  Depression   . Hyperlipidemia   . Osteopenia   . Allergy   . Anxiety    Past Surgical History  Procedure Laterality Date  . Appendectomy  1963   History  Substance Use Topics  . Smoking status: Former Smoker    Types: Cigarettes  . Smokeless tobacco: Never Used     Comment: teens  . Alcohol Use: No   Family History  Problem Relation Age of Onset  . Stroke Father 35  . Pneumonia Mother   . Stroke Mother     RAD  . Hypertension Brother     multiple brothers  . Hypertension Sister     multiple sisters  . Diabetes Daughter   . Diabetes Sister   . Cancer Sister     ? hysterectomy  . Cancer Sister     leukemia                                 Allergies  Allergen Reactions  . Amoxicillin-Pot Clavulanate     REACTION: rash all over  . Latex   . Ciprofloxacin Rash   Current Outpatient Prescriptions on File Prior to Visit  Medication Sig Dispense Refill  . aspirin 81 MG tablet Take 81 mg by mouth daily.        Marland Kitchen atorvastatin (LIPITOR) 20 MG tablet TAKE ONE TABLET BY MOUTH ONCE DAILY  30 tablet  3  . augmented betamethasone  dipropionate (DIPROLENE-AF) 0.05 % ointment USE AS DIRECTED ON SCALP  15 g  0  . B Complex-C (SUPER B COMPLEX PO) Take 1 tablet by mouth daily.        Marland Kitchen BIOTIN 5000 PO Take 10,000 mEq by mouth daily.        . Calcium Carbonate-Vitamin D (CALCIUM + D PO) Take 1 tablet by mouth daily.        . Cholecalciferol (VITAMIN D-3) 5000 UNITS TABS Take 1 tablet by mouth daily.        . Cyanocobalamin (B-12) 500 MCG TABS Take 1 tablet by mouth daily. 1000 UNITS      . esomeprazole (NEXIUM) 40 MG capsule Take 1 capsule (40 mg total) by mouth daily.  30 capsule  3  . Fish Oil OIL Take 1 tablet by mouth 2 (two) times daily.        . folic acid (FOLVITE) 505 MCG tablet Take 400 mcg by mouth daily.        . Loratadine-Pseudoephedrine (CLARITIN-D 12 HOUR PO) Take 1 tablet by mouth daily.        . Multiple Minerals-Vitamins (CALCIUM CITRATE +) TABS Take 1 tablet by mouth  2 (two) times daily.        . Multiple Vitamin (MULTIVITAMIN) tablet Take 1 tablet by mouth daily.        Marland Kitchen oxymetazoline (AFRIN) 0.05 % nasal spray Place 2 sprays into the nose 2 (two) times daily.      Marland Kitchen selenium sulfide (SELSUN) 2.5 % shampoo Apply topically daily as needed. Use as directed on scalp  118 mL  0  . sertraline (ZOLOFT) 50 MG tablet TAKE ONE TABLET BY MOUTH EVERY DAY  30 tablet  1  . zolpidem (AMBIEN) 5 MG tablet Take 1 tablet (5 mg total) by mouth at bedtime as needed for sleep.  15 tablet  1   No current facility-administered medications on file prior to visit.   The PMH, PSH, Social History, Family History, Medications, and allergies have been reviewed in [facility], and have been updated if relevant.    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 Constitutional: She appears well-developed and well-nourished. No distress.  HENT:  Head: Normocephalic and atraumatic.  Mouth/Throat: Uvula is midline. No oropharyngeal exudate, posterior oropharyngeal edema, posterior oropharyngeal erythema or tonsillar abscesses.            Assessment & Plan:   Sore throat - Plan: Rapid Strep A No Follow-up on file.

## 2014-01-25 NOTE — Progress Notes (Signed)
Pre visit review using our clinic review tool, if applicable. No additional management support is needed unless otherwise documented below in the visit note. 

## 2014-01-26 ENCOUNTER — Telehealth: Payer: Self-pay | Admitting: *Deleted

## 2014-01-26 ENCOUNTER — Other Ambulatory Visit: Payer: Self-pay | Admitting: *Deleted

## 2014-01-26 MED ORDER — SERTRALINE HCL 50 MG PO TABS: ORAL_TABLET | ORAL | Status: DC

## 2014-01-26 NOTE — Telephone Encounter (Signed)
She could not tolerate alendronate due to reflux. Yes please pursue prior auth.

## 2014-01-26 NOTE — Telephone Encounter (Signed)
PA required for Actonel. Approved meds are alendronate or ibandsonate. Do you want to try one of those first or do the PA?

## 2014-01-29 ENCOUNTER — Telehealth: Payer: Self-pay | Admitting: Family Medicine

## 2014-01-29 NOTE — Telephone Encounter (Signed)
[place]/patient [phone] called to request refill for spilled Nystatin.  Rx ordered 01/25/14 for 2 weeks for oral thrush.  Symptoms are improving.  Knocked over bottle during the weekend.  Educated that after hours nursing staff is available on weekends.  Nystatin (MYCOSTATIN) 100000 UNIT/ML suspension, Dispense: 60 mL, Sig: Take 5 mLs (500,000 Units total) by mouth 4 (four) times daily for 14 days. No refills.  Rx called to [facility] at [place] at [place] per MD protocol for spilled medication.  Reminded patient to be seen if symptoms worsen or fail to improve. Verbalized understanding.

## 2014-01-30 ENCOUNTER — Other Ambulatory Visit: Payer: Self-pay | Admitting: *Deleted

## 2014-01-30 MED ORDER — ZOLPIDEM TARTRATE 5 MG PO TABS: 5.0000 mg | ORAL_TABLET | Freq: Every evening | ORAL | Status: DC | PRN

## 2014-01-30 NOTE — Telephone Encounter (Signed)
Lm on pts vm informing her Rx has been called in to requested pharmacy 

## 2014-01-30 NOTE — Telephone Encounter (Signed)
#   30 last filled 01/03/14, pt has cpx scheduled 02/19/14.

## 2014-01-31 NOTE — Telephone Encounter (Signed)
PA submitted to cover my meds

## 2014-02-01 NOTE — Telephone Encounter (Signed)
Lm on pts vm informing her Rx has been approved and she may contact pharmacy for refill. PA approval received 01/31/14.

## 2014-02-02 ENCOUNTER — Ambulatory Visit: Payer: Self-pay | Admitting: Podiatry

## 2014-02-02 HISTORY — PX: FOOT SURGERY: SHX648

## 2014-02-05 ENCOUNTER — Other Ambulatory Visit: Payer: Self-pay

## 2014-02-05 DIAGNOSIS — M79609 Pain in unspecified limb: Secondary | ICD-10-CM

## 2014-02-05 MED ORDER — NYSTATIN 100000 UNIT/ML MT SUSP: 5.0000 mL | Freq: Four times a day (QID) | OROMUCOSAL | Status: DC

## 2014-02-05 NOTE — Telephone Encounter (Signed)
Pt notified refill was sent to [address]. Pt voiced understanding.

## 2014-02-05 NOTE — Telephone Encounter (Signed)
Pt left vm requesting refill on nystatin for her mouth; pt said mouth is a lot better, but still hurts some when pt eats. [facility] [address]

## 2014-02-05 NOTE — Telephone Encounter (Signed)
Pt left v/m; unable to understand message and called # on caller ID left v/m for pt to cb.

## 2014-02-12 ENCOUNTER — Telehealth: Payer: Self-pay | Admitting: Family Medicine

## 2014-02-12 MED ORDER — FLUCONAZOLE 150 MG PO TABS: ORAL_TABLET | ORAL | Status: DC

## 2014-02-12 NOTE — Telephone Encounter (Signed)
Pt called and mouth is still bothering her.  Would like to know if there is something else to prescribe for the thrush.  Best number to call pt is [phone].  Pharmacy of choice is [place].

## 2014-02-12 NOTE — Telephone Encounter (Signed)
Spoke to pt and informed her Rx has been sent to requested pharmacy 

## 2014-02-12 NOTE — Telephone Encounter (Signed)
We can try oral medication.  eRx sent.

## 2014-02-13 ENCOUNTER — Telehealth: Payer: Self-pay | Admitting: *Deleted

## 2014-02-13 ENCOUNTER — Encounter: Payer: Self-pay | Admitting: Podiatry

## 2014-02-13 DIAGNOSIS — M21549 Acquired clubfoot, unspecified foot: Secondary | ICD-10-CM

## 2014-02-13 DIAGNOSIS — M2010 Hallux valgus (acquired), unspecified foot: Secondary | ICD-10-CM

## 2014-02-13 NOTE — Telephone Encounter (Signed)
Does she need to sleep in her boot?  She had surgery this morning.  I informed her to yes, sleep in the boot.

## 2014-02-19 ENCOUNTER — Encounter: Payer: Medicare HMO | Admitting: Family Medicine

## 2014-02-19 DIAGNOSIS — Z0289 Encounter for other administrative examinations: Secondary | ICD-10-CM

## 2014-02-19 NOTE — Progress Notes (Signed)
1. [place] bunionectomy with cutting and moving bone with pin fixation right 2. Shortening osteotomy with screw 2nd rt

## 2014-02-20 ENCOUNTER — Ambulatory Visit (INDEPENDENT_AMBULATORY_CARE_PROVIDER_SITE_OTHER): Payer: Medicare HMO

## 2014-02-20 ENCOUNTER — Encounter: Payer: Self-pay | Admitting: Podiatry

## 2014-02-20 ENCOUNTER — Ambulatory Visit (INDEPENDENT_AMBULATORY_CARE_PROVIDER_SITE_OTHER): Payer: Medicare HMO | Admitting: Podiatry

## 2014-02-20 VITALS — BP 112/68 | HR 77 | Resp 16

## 2014-02-20 DIAGNOSIS — M2010 Hallux valgus (acquired), unspecified foot: Secondary | ICD-10-CM

## 2014-02-20 DIAGNOSIS — M21619 Bunion of unspecified foot: Secondary | ICD-10-CM

## 2014-02-20 DIAGNOSIS — M216X9 Other acquired deformities of unspecified foot: Secondary | ICD-10-CM

## 2014-02-20 NOTE — Progress Notes (Signed)
Subjective:     Patient ID: Megan Hobbs, female   DOB: 01/26/1941, 73 y.o.   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 Went ahead today and I reapplied sterile dressing dispense Darco shoe and instructed on the importance of immobilization. Reappoint 3 weeks earlier if any issues should occur

## 2014-02-22 ENCOUNTER — Telehealth: Payer: Self-pay | Admitting: *Deleted

## 2014-02-22 NOTE — Telephone Encounter (Signed)
Pt called wanted to know if she could change her bandage. Told her not to change bandage. Will need to keep bandage on till next appt. Pt understood.

## 2014-02-27 ENCOUNTER — Other Ambulatory Visit: Payer: Self-pay

## 2014-02-27 NOTE — Telephone Encounter (Signed)
Pt left v/m requesting refill alprazolam and zolpidem to [facility] [address]. Pt request cb when refilled.

## 2014-02-28 MED ORDER — ALPRAZOLAM 0.25 MG PO TABS: ORAL_TABLET | ORAL | Status: DC

## 2014-02-28 MED ORDER — ZOLPIDEM TARTRATE 5 MG PO TABS: 5.0000 mg | ORAL_TABLET | Freq: Every evening | ORAL | Status: DC | PRN

## 2014-02-28 NOTE — Telephone Encounter (Signed)
Ok to refill one time only. 

## 2014-02-28 NOTE — Telephone Encounter (Signed)
Pt left v/m requesting status of alprazolam and zolpidem refills; pt request cb.

## 2014-02-28 NOTE — Telephone Encounter (Signed)
Spoke to pt and informed her Rx has been called in to requested pharmacy 

## 2014-02-28 NOTE — Telephone Encounter (Signed)
Attempted to contact pharmacy to call in Rx;line busy

## 2014-03-05 ENCOUNTER — Other Ambulatory Visit: Payer: Self-pay

## 2014-03-05 MED ORDER — CYCLOBENZAPRINE HCL 5 MG PO TABS: 5.0000 mg | ORAL_TABLET | Freq: Three times a day (TID) | ORAL | Status: AC | PRN

## 2014-03-05 NOTE — Telephone Encounter (Signed)
Pt left v/m requesting refill cyclobenzaprine for back pain to [facility] [address]. Please advise.

## 2014-03-07 ENCOUNTER — Telehealth: Payer: Self-pay | Admitting: *Deleted

## 2014-03-07 NOTE — Telephone Encounter (Signed)
Pt checking on status of muscle relaxant; advised sent to [address] on 03/05/14; pt will ck with pharmacy.

## 2014-03-07 NOTE — Telephone Encounter (Signed)
Pt called and said her bandage was really dirty and smelly. Had surgery on 5.12.15 and last appt was 5.19.15. Told her ok to change bandage and will see her on 6.9.15. Pt understood.

## 2014-03-12 ENCOUNTER — Other Ambulatory Visit: Payer: Self-pay | Admitting: *Deleted

## 2014-03-12 MED ORDER — NYSTATIN 100000 UNIT/ML MT SUSP: 5.0000 mL | Freq: Four times a day (QID) | OROMUCOSAL | Status: DC

## 2014-03-12 NOTE — Telephone Encounter (Signed)
Pt requesting medication refill of abx. Last ov 01/2014 with f/u appt 06/2014/ pls advise

## 2014-03-13 ENCOUNTER — Encounter: Payer: Self-pay | Admitting: Podiatry

## 2014-03-13 ENCOUNTER — Ambulatory Visit (INDEPENDENT_AMBULATORY_CARE_PROVIDER_SITE_OTHER): Payer: Medicare HMO | Admitting: Podiatry

## 2014-03-13 ENCOUNTER — Ambulatory Visit (INDEPENDENT_AMBULATORY_CARE_PROVIDER_SITE_OTHER): Payer: Medicare HMO

## 2014-03-13 DIAGNOSIS — R609 Edema, unspecified: Secondary | ICD-10-CM

## 2014-03-13 DIAGNOSIS — M2010 Hallux valgus (acquired), unspecified foot: Secondary | ICD-10-CM

## 2014-03-13 NOTE — Progress Notes (Signed)
Subjective:     Patient ID: Megan Hobbs, female   DOB: November 20, 1940, 73 y.o.   MRN: [id number]  HPI patient presents stating that she is doing well with her foot but she will get some swelling if she's on it all day   Review of Systems     Objective:   Physical Exam Neurovascular status intact negative Homans sign noted with well-healing surgical sites right foot 4 weeks duration surgery. Wound edges are well coapted and range of motion of the first MPJ is good    Assessment:     Doing well post surgery of the right foot    Plan:     X-ray reviewed and allow patient to return gradually to soft shoes. Doing very well  surgery

## 2014-03-28 ENCOUNTER — Other Ambulatory Visit: Payer: Self-pay | Admitting: *Deleted

## 2014-03-28 MED ORDER — SERTRALINE HCL 50 MG PO TABS: ORAL_TABLET | ORAL | Status: DC

## 2014-03-28 MED ORDER — ALPRAZOLAM 0.25 MG PO TABS: ORAL_TABLET | ORAL | Status: DC

## 2014-03-28 MED ORDER — ZOLPIDEM TARTRATE 5 MG PO TABS: 5.0000 mg | ORAL_TABLET | Freq: Every evening | ORAL | Status: DC | PRN

## 2014-03-28 NOTE — Telephone Encounter (Signed)
Pt requesting medication refill. Pt has not had f/u appt in 2+ yrs and has cancelled the last 2 sched CPE. Pt has upcoming appt 06/2014. pls advise

## 2014-03-28 NOTE — Telephone Encounter (Signed)
Spoke to pt and informed her Rx has been called in to requested pharmacy. Pt advised f/u appt required for additional refill; sched for 04/23/14

## 2014-04-02 ENCOUNTER — Telehealth: Payer: Self-pay | Admitting: Family Medicine

## 2014-04-02 MED ORDER — FLUCONAZOLE 150 MG PO TABS: ORAL_TABLET | ORAL | Status: DC

## 2014-04-02 MED ORDER — NYSTATIN 100000 UNIT/ML MT SUSP: 5.0000 mL | Freq: Four times a day (QID) | OROMUCOSAL | Status: DC

## 2014-04-02 NOTE — Telephone Encounter (Signed)
Spoke to pt and advised per Dr Deborra Medina; informed Rx sent to requested pharmacy

## 2014-04-02 NOTE — Telephone Encounter (Signed)
Rx sent.  Needs to follow up if no improvement.

## 2014-04-02 NOTE — Telephone Encounter (Signed)
Patient Information:  Caller Name: Kameria  Phone: [phone]  Patient: Megan, Hobbs  Gender: Female  DOB: 07/09/1941  Age: 73 Years  PCP: Arnette Norris [facility])  Office Follow Up:  Does the office need to follow up with this patient?: Yes  Instructions For The Office: Pharmacy [place].  Patient requesting Diflucan and Nystatin medication together for repeated Thrush outbreak. She states she cannot drive. Recovering from surgery and has not been released to drive.  Please review and contact patient.  RN Note:  Pharmacy [place].  Patient requesting Diflucan and Nystatin medication together for repeated Thrush outbreak. She states she cannot drive. Recovering from surgery and has not been released to drive.  Please review and contact patient.  Symptoms  Reason For Call & Symptoms: Patient was seen by physician on 01/25/14 for Reubens and she was given Rx for Nystatin . Patients mouth was still bothering her and medication Diflucan was called in again on 02/12/14.   Thrush went away after taking pills.  Then again 03/12/14 another Rx was given for Nystatin it did not completely go away. She reports icurrently with Thrush again  on Tongue- white on both sides red in center.  No bleeding, slight uncomfortable with eating. No sore on lips.   She is requesting pill and Nystatin together.  She report recent surgery- Foot and bunion - she states she cannot drive she has not been released.  Reviewed Health History In EMR: Yes  Reviewed Medications In EMR: Yes  Reviewed Allergies In EMR: Yes  Reviewed Surgeries / Procedures: Yes  Date of Onset of Symptoms: 03/26/2014  Guideline(s) Used:  No Protocol Available - Sick Adult  Disposition Per Guideline:   Discuss with PCP and Callback by Nurse Today  Reason For Disposition Reached:   Nursing judgment  Advice Given:  N/A  RN Overrode Recommendation:  Patient Requests Prescription  Pharmacy [place].  Patient requesting Diflucan and Nystatin medication together for repeated Thrush outbreak. She states she cannot drive. Recovering from surgery and has not been released to drive.  Please review and contact patient.

## 2014-04-03 ENCOUNTER — Ambulatory Visit (INDEPENDENT_AMBULATORY_CARE_PROVIDER_SITE_OTHER): Payer: Medicare HMO

## 2014-04-03 ENCOUNTER — Encounter: Payer: Self-pay | Admitting: Podiatry

## 2014-04-03 ENCOUNTER — Ambulatory Visit (INDEPENDENT_AMBULATORY_CARE_PROVIDER_SITE_OTHER): Payer: Medicare HMO | Admitting: Podiatry

## 2014-04-03 VITALS — BP 146/77 | HR 104 | Resp 16

## 2014-04-03 DIAGNOSIS — M21619 Bunion of unspecified foot: Secondary | ICD-10-CM

## 2014-04-03 DIAGNOSIS — M21611 Bunion of right foot: Secondary | ICD-10-CM

## 2014-04-03 NOTE — Progress Notes (Signed)
Subjective:     Patient ID: Megan Hobbs, female   DOB: 20-Jun-1941, 73 y.o.   MRN: [id number]  HPI patient states that her foot is doing well but still can be painful by the end of the day   Review of Systems     Objective:   Physical Exam Neurovascular status intact with patient's incision site right first and second metatarsal healing very well with good alignment and good range of motion of the first MPJ and no discomfort when palpated    Assessment:     Progressing well with swelling still present    Plan:     Reviewed x-rays and explained its normal still have some discomfort and that this will gradually get better but elevation and ibuprofen are still necessary during this period of the postoperative process

## 2014-04-16 ENCOUNTER — Encounter: Payer: Self-pay | Admitting: Podiatry

## 2014-04-19 ENCOUNTER — Encounter: Payer: Self-pay | Admitting: Family Medicine

## 2014-04-19 ENCOUNTER — Ambulatory Visit (INDEPENDENT_AMBULATORY_CARE_PROVIDER_SITE_OTHER): Payer: Medicare HMO | Admitting: Family Medicine

## 2014-04-19 VITALS — BP 134/74 | HR 78 | Temp 97.9°F | Wt 119.5 lb

## 2014-04-19 DIAGNOSIS — B370 Candidal stomatitis: Secondary | ICD-10-CM

## 2014-04-19 DIAGNOSIS — E785 Hyperlipidemia, unspecified: Secondary | ICD-10-CM

## 2014-04-19 DIAGNOSIS — K219 Gastro-esophageal reflux disease without esophagitis: Secondary | ICD-10-CM

## 2014-04-19 LAB — COMPREHENSIVE METABOLIC PANEL
ALT: 19 U/L (ref 0–35)
AST: 26 U/L (ref 0–37)
Albumin: 4.2 g/dL (ref 3.5–5.2)
Alkaline Phosphatase: 62 U/L (ref 39–117)
BUN: 8 mg/dL (ref 6–23)
CHLORIDE: 101 meq/L (ref 96–112)
CO2: 30 mEq/L (ref 19–32)
CREATININE: 0.6 mg/dL (ref 0.4–1.2)
Calcium: 10 mg/dL (ref 8.4–10.5)
GFR: 98.64 mL/min (ref >60.00)
Glucose, Bld: 108 mg/dL — ABNORMAL HIGH (ref 70–99)
Potassium: 4.1 mEq/L (ref 3.5–5.1)
SODIUM: 139 meq/L (ref 135–145)
TOTAL PROTEIN: 7.5 g/dL (ref 6.0–8.3)
Total Bilirubin: 0.3 mg/dL (ref 0.2–1.2)

## 2014-04-19 LAB — LIPID PANEL
Cholesterol: 220 mg/dL — ABNORMAL HIGH (ref 0–200)
HDL: 50.5 mg/dL (ref >39.00)
NonHDL: 169.5
Total CHOL/HDL Ratio: 4
Triglycerides: 435 mg/dL — ABNORMAL HIGH (ref 0.0–149.0)
VLDL: 87 mg/dL — AB (ref 0.0–40.0)

## 2014-04-19 MED ORDER — ATORVASTATIN CALCIUM 20 MG PO TABS: ORAL_TABLET | ORAL | Status: DC

## 2014-04-19 NOTE — Progress Notes (Signed)
   Subjective:   Patient ID: Megan Hobbs, female    DOB: Aug 11, 1941, 73 y.o.   MRN: [id number]  Megan Hobbs is a pleasant 73 y.o. year old female who presents to clinic today with Follow-up  on 04/19/2014  HPI: [place] surgery last month (Dr. Paulla Dolly). Feels she is recovering ok. Continues to have recurrent thrush.  This has improved.  HLD- overdue for labs. Lab Results  Component Value Date   CHOL 195 07/11/2012   HDL 62.60 07/11/2012   LDLCALC 93 07/11/2012   LDLDIRECT 137.2 09/22/2010   TRIG 197.0* 07/11/2012   CHOLHDL 3 07/11/2012   On lipitor 20 mg daily.  GERD- feels nexium 40 mg not working as well. Colonoscopy 06/02/11- Dr. Fuller Plan. Has never had an endoscopy. No difficulty swallowing food.  Review of Systems See HPI No black stools No nausea or vomiting +sour taste in her mouth in the morning    Objective:    BP 134/74  Pulse 78  Temp(Src) 97.9 F (36.6 C) (Oral)  Wt 119 lb 8 oz (54.205 kg)  SpO2 98%   Physical Exam  Nursing note and vitals reviewed. Constitutional: She appears well-developed and well-nourished. No distress.  Cardiovascular: Normal rate and regular rhythm.   Pulmonary/Chest: Effort normal and breath sounds normal. No respiratory distress.  Skin: Skin is warm.  Psychiatric: She has a normal mood and affect. Her behavior is normal. Judgment and thought content normal.          Assessment & Plan:   Gastroesophageal reflux disease, esophagitis presence not specified - Plan: H Pylori, IGM, IGG, IGA AB  HYPERLIPIDEMIA - Plan: Comprehensive metabolic panel, Lipid panel  Oral thrush No Follow-up on file.

## 2014-04-19 NOTE — Assessment & Plan Note (Signed)
Continue lipitor. Check labs today. Due for CPX.

## 2014-04-19 NOTE — Assessment & Plan Note (Signed)
Improved

## 2014-04-19 NOTE — Assessment & Plan Note (Signed)
Deteriorated. Will check for H Pylori. She wants to increase dose of Nexium- I advised her against this.  Consider adding H2 blocker or referral back to GI for endoscopy/treatment. She will think about it and await results of lab work today. Orders Placed This Encounter  Procedures  . Comprehensive metabolic panel  . Lipid panel  . H Pylori, IGM, IGG, IGA AB

## 2014-04-19 NOTE — Patient Instructions (Signed)
Good to see you. We will call you with your lab results.   

## 2014-04-19 NOTE — Progress Notes (Signed)
Pre visit review using our clinic review tool, if applicable. No additional management support is needed unless otherwise documented below in the visit note. 

## 2014-04-23 ENCOUNTER — Ambulatory Visit: Payer: Medicare HMO | Admitting: Family Medicine

## 2014-04-23 ENCOUNTER — Other Ambulatory Visit: Payer: Self-pay | Admitting: *Deleted

## 2014-04-23 ENCOUNTER — Other Ambulatory Visit: Payer: Self-pay | Admitting: Family Medicine

## 2014-04-23 LAB — H PYLORI, IGM, IGG, IGA AB
H Pylori IgG: <0.9 U/mL (ref 0.0–0.8)
H. PYLORI, IGM ABS: 9.5 U — AB (ref 0.0–8.9)
H. pylori, IgA Abs: <9 units (ref 0.0–8.9)

## 2014-04-23 MED ORDER — BIS SUBCIT-METRONID-TETRACYC 140-125-125 MG PO CAPS: 3.0000 | ORAL_CAPSULE | Freq: Three times a day (TID) | ORAL | Status: DC

## 2014-04-23 NOTE — Telephone Encounter (Signed)
Pt contacted office and states that she is needing medication refill. Refill due 07/24 but pt will be leaving and going out of town on 07/23. Last f/u appt 04/2014. pls advise

## 2014-04-24 ENCOUNTER — Telehealth: Payer: Self-pay

## 2014-04-24 MED ORDER — SERTRALINE HCL 50 MG PO TABS: ORAL_TABLET | ORAL | Status: DC

## 2014-04-24 MED ORDER — ALPRAZOLAM 0.25 MG PO TABS: ORAL_TABLET | ORAL | Status: DC

## 2014-04-24 MED ORDER — CLARITHROMYCIN 500 MG PO TABS: 500.0000 mg | ORAL_TABLET | Freq: Two times a day (BID) | ORAL | Status: DC

## 2014-04-24 MED ORDER — ZOLPIDEM TARTRATE 5 MG PO TABS: 5.0000 mg | ORAL_TABLET | Freq: Every evening | ORAL | Status: DC | PRN

## 2014-04-24 MED ORDER — METRONIDAZOLE 500 MG PO TABS: 500.0000 mg | ORAL_TABLET | Freq: Two times a day (BID) | ORAL | Status: DC

## 2014-04-24 NOTE — Telephone Encounter (Signed)
eRx sent for 10 day course flagyl ( amoxicillin allergic) and clarithromycin. Continue to take nexium with this.

## 2014-04-24 NOTE — Telephone Encounter (Signed)
Spoke to pt and advised per Dr Aron. Pt verbally expressed understanding.  

## 2014-04-24 NOTE — Telephone Encounter (Signed)
Pt left v/m [place] cost to pt was $700.00. Pt request substitute med to [place]. Spoke with Margarita Grizzle at [employer] and she said ins does not cover Pylera even with prior auth.pt request cb.

## 2014-04-24 NOTE — Telephone Encounter (Signed)
Rx called in to requested pharmacy 

## 2014-04-24 NOTE — Telephone Encounter (Signed)
Pt left v/m requesting cb at [phone] or [phone]; pt going out of town on 04/26/14 and request cb.

## 2014-05-04 ENCOUNTER — Telehealth: Payer: Self-pay

## 2014-05-04 NOTE — Telephone Encounter (Signed)
The antibiotic will be in her system for a little while after she finishes taking it. I would advise her to wait until Monday, if no improvement follow up at that time

## 2014-05-04 NOTE — Telephone Encounter (Signed)
Left message on voicemail.

## 2014-05-04 NOTE — Telephone Encounter (Signed)
Pt left v/m; pt has one more pill of antibiotic started on 04/24/14 left and pt still has small area of thrush in pts mouth; pt has thrush on tip of tongue and center of mouth. pt is not sure if needs to have more antibiotic or what to do. Pt request cb.[place]

## 2014-05-04 NOTE — Telephone Encounter (Signed)
Pt is aware as instructed and will f/u if needed Monday

## 2014-05-07 MED ORDER — NYSTATIN 100000 UNIT/ML MT SUSP: 5.0000 mL | Freq: Four times a day (QID) | OROMUCOSAL | Status: DC

## 2014-05-07 NOTE — Telephone Encounter (Signed)
Pt left v/m requesting cb that pt phone is not working well and wants to speak with someone.

## 2014-05-07 NOTE — Telephone Encounter (Signed)
Will refill nystatin mouth rinse. Keep [place] update with her symptoms.

## 2014-05-07 NOTE — Telephone Encounter (Signed)
Pt left v/m with update of thrush; thrush still not completely cleared and wants to know what to do? See phone note from 05/04/14.[facility] [address].

## 2014-05-07 NOTE — Addendum Note (Signed)
Addended by: Lucille Passy on: 05/07/2014 11:44 AM   Modules accepted: Orders

## 2014-05-07 NOTE — Telephone Encounter (Signed)
Attempted to contact pt back; lm on all phone numbers listed.

## 2014-05-07 NOTE — Telephone Encounter (Signed)
Lm on pts vm informing her Rx has been sent to requested pharmacy and to contact us with updated s/s

## 2014-05-09 ENCOUNTER — Encounter: Payer: Self-pay | Admitting: Family Medicine

## 2014-05-09 ENCOUNTER — Ambulatory Visit (INDEPENDENT_AMBULATORY_CARE_PROVIDER_SITE_OTHER): Payer: Medicare HMO | Admitting: Family Medicine

## 2014-05-09 ENCOUNTER — Ambulatory Visit: Payer: Medicare HMO | Admitting: Family Medicine

## 2014-05-09 VITALS — BP 120/70 | HR 86 | Temp 98.3°F | Ht <= 58 in | Wt 123.5 lb

## 2014-05-09 DIAGNOSIS — M79672 Pain in left foot: Secondary | ICD-10-CM

## 2014-05-09 DIAGNOSIS — M7742 Metatarsalgia, left foot: Secondary | ICD-10-CM

## 2014-05-09 DIAGNOSIS — M79609 Pain in unspecified limb: Secondary | ICD-10-CM

## 2014-05-09 DIAGNOSIS — M5416 Radiculopathy, lumbar region: Secondary | ICD-10-CM

## 2014-05-09 DIAGNOSIS — M7750 Other enthesopathy of unspecified foot: Secondary | ICD-10-CM

## 2014-05-09 DIAGNOSIS — IMO0002 Reserved for concepts with insufficient information to code with codable children: Secondary | ICD-10-CM

## 2014-05-09 MED ORDER — PREDNISONE 20 MG PO TABS: ORAL_TABLET | ORAL | Status: DC

## 2014-05-09 NOTE — Progress Notes (Signed)
Pre visit review using our clinic review tool, if applicable. No additional management support is needed unless otherwise documented below in the visit note. 

## 2014-05-09 NOTE — Progress Notes (Signed)
[place] [address]                Phone: [phone]                  Fax: [phone] 05/09/2014  ID: Megan Hobbs   MRN: [id number]  DOB: 02/05/1941  Primary Physician:  Arnette Norris, MD  Chief Complaint: Foot Pain  Subjective:   History of Present Illness:  Megan Hobbs is a 73 y.o. very pleasant female patient who presents with the following:  L foot, not an excruciating pain. Sometimes shoots all the way on the left and up left. At patient is only a fair historian, and she has a history of right-sided bunionectomy fairly recently. Now she has had over the last week or so some significant swelling on the forefoot, and pain around the shafts of 23 and 4 metatarsals. She has no injury that she can recall. She has otherwise no significant pain around the foot and ankle.  L radiculopathy also. Megan Hobbs is complaining of some significant back pain, mostly on the LEFT having intermittent radiculopathy down the LEFT leg. She has had no trauma or accident. She has never had any significant back operation.  Past Medical History, Surgical History, Social History, Family History, Problem List, Medications, and Allergies have been reviewed and updated if relevant.  Review of Systems:  GEN: No fevers, chills. Nontoxic. Primarily MSK c/o today. MSK: Detailed in the HPI GI: tolerating PO intake without difficulty Neuro: As above Otherwise the pertinent positives of the ROS are noted above.   Objective:   Physical Examination: BP 120/70  Pulse 86  Temp(Src) 98.3 F (36.8 C) (Oral)  Ht 4\' 9"  (1.448 m)  Wt 123 lb 8 oz (56.019 kg)  BMI 26.72 kg/m2   GEN: Well-developed,well-nourished,in no acute distress; alert,appropriate and cooperative throughout examination HEENT: Normocephalic and atraumatic without obvious abnormalities. Ears, externally no deformities PULM: Breathing comfortably in no respiratory distress EXT: No clubbing, cyanosis, or  edema PSYCH: Normally interactive. Cooperative during the interview. Pleasant. Friendly and conversant. Not anxious or depressed appearing. Normal, full affect.  Range of motion at  the waist: Flexion, extension, lateral bending and rotation:   No echymosis or edema Rises to examination table with mild difficulty Gait: minimally antalgic  Inspection/Deformity: N Paraspinus Tenderness: minimal from the level of L4-S1.  B Ankle Dorsiflexion (L5,4): 5/5 B Great Toe Dorsiflexion (L5,4): 5/5 Heel Walk (L5): WNL Toe Walk (S1): WNL Rise/Squat (L4): WNL, mild pain  SENSORY B Medial Foot (L4): WNL B Dorsum (L5): WNL B Lateral (S1): WNL Light Touch: WNL Pinprick: WNL  REFLEXES Knee (L4): 2+ Ankle (S1): 2+  B SLR, seated: neg B SLR, supine: neg B FABER: neg B Reverse FABER: neg B Greater Troch: NT B Log Roll: neg B Sciatic Notch: NT    FEET: LEFT Echymosis: no Edema: mild on dorsum of foot ROM: full LE B Gait: heel toe, non-antalgic MT pain: mild at 3-4 shaft Callus pattern: none Lateral Mall: NT Medial Mall: NT Talus: NT Navicular: NT Cuboid: NT Calcaneous: NT Metatarsals: NT 5th MT: NT Phalanges: NT Achilles: NT Plantar Fascia: NT Fat Pad: NT Peroneals: NT Post Tib: NT Great Toe: Nml motion Ant Drawer: neg ATFL: NT CFL: NT Deltoid: NT Other foot breakdown: none Long arch: preserved Transverse arch: preserved Hindfoot breakdown: none Sensation: intact  Radiology: No results found.   Assessment & Plan:   Metatarsalgia of left foot  Left foot pain  The patient is primarily here for foot pain. She actually had a bunionectomy on the RIGHT within the last 2 or 3 months. I am O. Suspected she had some alteration in her gait and then had some irritation and swelling. The toes move well. She certainly could have a neuroma that's bothering her a little bit. Right now, I'm going to have her placed in a postoperative shoe, and she has this at home. Also  custom crafted a metatarsal plate to try to limit her flexion while she is at work at [employer].  She also has some back symptoms and some occasional radiculopathy on the LEFT. She brings this up at the end of the visit. Had not fully evaluated this, but it would be quite common for 73 year old have occasional back pain and radiculopathy. I will give her a short dose steroids to see if this helps with her symptoms.  New Prescriptions   PREDNISONE (DELTASONE) 20 MG TABLET    2 tabs po for 4 days, then 1 tab po for 4 days   Discontinued Medications   ALENDRONATE (FOSAMAX) 70 MG TABLET       BISMUTH-METRONIDAZOLE-TETRACYCLINE (PYLERA) 140-125-125 MG PER CAPSULE    Take 3 capsules by mouth 4 (four) times daily -  before meals and at bedtime.   CLARITHROMYCIN (BIAXIN) 500 MG TABLET    Take 1 tablet (500 mg total) by mouth 2 (two) times daily.   LORATADINE-PSEUDOEPHEDRINE (CLARITIN-D 12 HOUR PO)    Take 1 tablet by mouth daily.     MEPERIDINE (DEMEROL) 50 MG TABLET    Take 50 mg by mouth every 4 (four) hours as needed for severe pain.   METRONIDAZOLE (FLAGYL) 500 MG TABLET    Take 1 tablet (500 mg total) by mouth 2 (two) times daily.   OXYMETAZOLINE (AFRIN) 0.05 % NASAL SPRAY    Place 2 sprays into the nose 2 (two) times daily.   PROMETHAZINE (PHENERGAN) 25 MG TABLET    Take 25 mg by mouth every 4 (four) hours as needed for nausea or vomiting.   RISEDRONATE (ACTONEL) 30 MG TABLET    Take 1 tablet (30 mg total) by mouth every 7 (seven) days. with water on empty stomach, nothing by mouth or lie down for next 30 minutes.   TRAZODONE (DESYREL) 50 MG TABLET       Modified Medications   No medications on file   No orders of the defined types were placed in this encounter.   Follow-up: Return in about 3 weeks (around 05/30/2014). Unless noted above, the patient is to follow-up if symptoms worsen. Red flags were reviewed with the patient.  Signed,  Maud Deed. Malarie Tappen, MD, [facility]  Current  Medications at Discharge:   Medication List       This list is accurate as of: 05/09/14 11:59 PM.  Always use your most recent med list.               ALPRAZolam 0.25 MG tablet  Commonly known as:  XANAX  TAKE ONE-HALF TO ONE TABLET BY MOUTH EVERY DAY AS NEEDED     aspirin 81 MG tablet  Take 81 mg by mouth daily.     atorvastatin 20 MG tablet  Commonly known as:  LIPITOR  TAKE ONE TABLET BY MOUTH ONCE DAILY     augmented betamethasone dipropionate 0.05 % ointment  Commonly known as:  DIPROLENE-AF  USE AS DIRECTED ON SCALP     B-12 500 MCG Tabs  Take 1 tablet by mouth daily. 1000 UNITS     BIOTIN 5000 PO  Take 10,000 mEq by mouth daily.     CALCIUM + D PO  Take 1 tablet by mouth daily.     CALCIUM CITRATE + Tabs  Take 1 tablet by mouth 2 (two) times daily.     esomeprazole 40 MG capsule  Commonly known as:  NEXIUM  Take 1 capsule (40 mg total) by mouth daily.     Fish Oil Oil  Take 1 tablet by mouth 2 (two) times daily.     fluticasone 50 MCG/ACT nasal spray  Commonly known as:  FLONASE  Place 1 spray into both nostrils daily as needed for allergies or rhinitis.     folic acid 062 MCG tablet  Commonly known as:  FOLVITE  Take 400 mcg by mouth daily.     multivitamin tablet  Take 1 tablet by mouth daily.     nystatin 100000 UNIT/ML suspension  Commonly known as:  MYCOSTATIN  Take 5 mLs (500,000 Units total) by mouth 4 (four) times daily.     predniSONE 20 MG tablet  Commonly known as:  DELTASONE  2 tabs po for 4 days, then 1 tab po for 4 days     selenium sulfide 2.5 % shampoo  Commonly known as:  SELSUN  Apply topically daily as needed. Use as directed on scalp     sertraline 50 MG tablet  Commonly known as:  ZOLOFT  TAKE ONE TABLET BY MOUTH EVERY DAY     SUPER B COMPLEX PO  Take 1 tablet by mouth daily.     Vitamin D-3 5000 UNITS Tabs  Take 1 tablet by mouth daily.     zolpidem 5 MG tablet  Commonly known as:  AMBIEN  Take 1 tablet (5 mg  total) by mouth at bedtime as needed for sleep.

## 2014-05-14 ENCOUNTER — Ambulatory Visit (INDEPENDENT_AMBULATORY_CARE_PROVIDER_SITE_OTHER): Payer: Medicare HMO | Admitting: Family Medicine

## 2014-05-14 ENCOUNTER — Encounter: Payer: Self-pay | Admitting: Family Medicine

## 2014-05-14 VITALS — BP 124/72 | HR 73 | Temp 98.5°F | Ht <= 58 in | Wt 122.5 lb

## 2014-05-14 DIAGNOSIS — K1379 Other lesions of oral mucosa: Secondary | ICD-10-CM

## 2014-05-14 DIAGNOSIS — B370 Candidal stomatitis: Secondary | ICD-10-CM

## 2014-05-14 DIAGNOSIS — K1370 Unspecified lesions of oral mucosa: Secondary | ICD-10-CM

## 2014-05-14 MED ORDER — MAGIC MOUTHWASH W/LIDOCAINE: 5.0000 mL | Freq: Four times a day (QID) | ORAL | Status: DC | PRN

## 2014-05-14 MED ORDER — FLUCONAZOLE 200 MG PO TABS: ORAL_TABLET | ORAL | Status: DC

## 2014-05-14 NOTE — Progress Notes (Signed)
Pre visit review using our clinic review tool, if applicable. No additional management support is needed unless otherwise documented below in the visit note. 

## 2014-05-14 NOTE — Progress Notes (Signed)
[place] [address]                Phone: [phone]                  Fax: [phone] 05/14/2014  ID: Tamkia Hobbs   MRN: [id number]  DOB: 1940/12/25  Primary Physician:  Arnette Norris, MD  Chief Complaint: Thrush  Subjective:   History of Present Illness:  Megan Hobbs is a 73 y.o. very pleasant female patient who presents with the following:  Failed 4 different treatment regiments. Still now with plaques and pain in her mouth.   Past Medical History, Surgical History, Social History, Family History, Problem List, Medications, and Allergies have been reviewed and updated if relevant.  Review of Systems:  GEN: No acute illnesses, no fevers, chills. GI: No n/v/d, eating normally Pulm: No SOB Interactive and getting along well at home.  Otherwise, ROS is as per the HPI.  Objective:   Physical Examination: BP 124/72  Pulse 73  Temp(Src) 98.5 F (36.9 C) (Oral)  Ht 4\' 9"  (1.448 m)  Wt 122 lb 8 oz (55.566 kg)  BMI 26.50 kg/m2   GEN: WDWN, NAD, Non-toxic, A & O x 3 HEENT: Atraumatic, Normocephalic. Neck supple. No masses, No LAD. Ears and Nose: No external deformity. CV: RRR, No M/G/R. No JVD. No thrill. No extra heart sounds. PULM: CTA B, no wheezes, crackles, rhonchi. No retractions. No resp. distress. No accessory muscle use. EXTR: No c/c/e NEURO Normal gait.  PSYCH: Normally interactive. Conversant. Not depressed or anxious appearing.  Calm demeanor.   Laboratory and Imaging Data:  Assessment & Plan:   Oropharyngeal candidiasis  Mouth pain   Treat with longer pattern, like with esophageal and higher dosage.   New Prescriptions   ALUM & MAG HYDROXIDE-SIMETH (MAGIC MOUTHWASH W/LIDOCAINE) SOLN    Take 5 mLs by mouth 4 (four) times daily as needed for mouth pain. Mix: 80 ml maalox, 80 ml benadryl susp, 80 mL lidocaine 1%   FLUCONAZOLE (DIFLUCAN) 200 MG TABLET    2 tabs po on 1st day, then 1 tab po daily    Discontinued Medications   No medications on file   Modified Medications   No medications on file   No orders of the defined types were placed in this encounter.   Follow-up: No Follow-up on file. Unless noted above, the patient is to follow-up if symptoms worsen. Red flags were reviewed with the patient.  Signed,  Maud Deed. Victorhugo Preis, MD, [facility]  Current Medications at Discharge:   Medication List       This list is accurate as of: 05/14/14  2:07 PM.  Always use your most recent med list.               ALPRAZolam 0.25 MG tablet  Commonly known as:  XANAX  TAKE ONE-HALF TO ONE TABLET BY MOUTH EVERY DAY AS NEEDED     aspirin 81 MG tablet  Take 81 mg by mouth daily.     atorvastatin 20 MG tablet  Commonly known as:  LIPITOR  TAKE ONE TABLET BY MOUTH ONCE DAILY     augmented betamethasone dipropionate 0.05 % ointment  Commonly known as:  DIPROLENE-AF  USE AS DIRECTED ON SCALP     B-12 500 MCG Tabs  Take 1 tablet by mouth  daily. 1000 UNITS     BIOTIN 5000 PO  Take 10,000 mEq by mouth daily.     CALCIUM + D PO  Take 1 tablet by mouth daily.     CALCIUM CITRATE + Tabs  Take 1 tablet by mouth 2 (two) times daily.     esomeprazole 40 MG capsule  Commonly known as:  NEXIUM  Take 1 capsule (40 mg total) by mouth daily.     Fish Oil Oil  Take 1 tablet by mouth 2 (two) times daily.     fluconazole 200 MG tablet  Commonly known as:  DIFLUCAN  2 tabs po on 1st day, then 1 tab po daily     fluticasone 50 MCG/ACT nasal spray  Commonly known as:  FLONASE  Place 1 spray into both nostrils daily as needed for allergies or rhinitis.     folic acid 170 MCG tablet  Commonly known as:  FOLVITE  Take 400 mcg by mouth daily.     magic mouthwash w/lidocaine Soln  Take 5 mLs by mouth 4 (four) times daily as needed for mouth pain. Mix: 80 ml maalox, 80 ml benadryl susp, 80 mL lidocaine 1%     multivitamin tablet  Take 1 tablet by mouth daily.      nystatin 100000 UNIT/ML suspension  Commonly known as:  MYCOSTATIN  Take 5 mLs (500,000 Units total) by mouth 4 (four) times daily.     predniSONE 20 MG tablet  Commonly known as:  DELTASONE  2 tabs po for 4 days, then 1 tab po for 4 days     selenium sulfide 2.5 % shampoo  Commonly known as:  SELSUN  Apply topically daily as needed. Use as directed on scalp     sertraline 50 MG tablet  Commonly known as:  ZOLOFT  TAKE ONE TABLET BY MOUTH EVERY DAY     SUPER B COMPLEX PO  Take 1 tablet by mouth daily.     Vitamin D-3 5000 UNITS Tabs  Take 1 tablet by mouth daily.     zolpidem 5 MG tablet  Commonly known as:  AMBIEN  Take 1 tablet (5 mg total) by mouth at bedtime as needed for sleep.

## 2014-05-22 ENCOUNTER — Other Ambulatory Visit: Payer: Self-pay | Admitting: *Deleted

## 2014-05-22 MED ORDER — SERTRALINE HCL 50 MG PO TABS: ORAL_TABLET | ORAL | Status: DC

## 2014-05-22 MED ORDER — ALPRAZOLAM 0.25 MG PO TABS: ORAL_TABLET | ORAL | Status: DC

## 2014-05-22 MED ORDER — ZOLPIDEM TARTRATE 5 MG PO TABS: 5.0000 mg | ORAL_TABLET | Freq: Every evening | ORAL | Status: DC | PRN

## 2014-05-22 NOTE — Telephone Encounter (Signed)
Pt requesting medication refill. Last f/u appt 04/2014 with upcoming 06/2014 appt. pls advise

## 2014-05-23 MED ORDER — ZOLPIDEM TARTRATE 5 MG PO TABS: 5.0000 mg | ORAL_TABLET | Freq: Every evening | ORAL | Status: DC | PRN

## 2014-05-23 MED ORDER — ALPRAZOLAM 0.25 MG PO TABS: ORAL_TABLET | ORAL | Status: DC

## 2014-05-23 NOTE — Addendum Note (Signed)
Addended by: Modena Nunnery on: 05/23/2014 09:10 AM   Modules accepted: Orders

## 2014-05-23 NOTE — Telephone Encounter (Signed)
Pt left v/m requesting cb about status of multiple refills requested on 05/22/14.

## 2014-05-23 NOTE — Telephone Encounter (Signed)
Pt left v/m; [facility] [address] does not have alprazolam and zolpidem refill.Please advise. Pt request cb.

## 2014-05-23 NOTE — Telephone Encounter (Signed)
Rx faxed to requested pharmacy 

## 2014-05-24 ENCOUNTER — Encounter: Payer: Self-pay | Admitting: Family Medicine

## 2014-05-24 ENCOUNTER — Ambulatory Visit (INDEPENDENT_AMBULATORY_CARE_PROVIDER_SITE_OTHER): Payer: Medicare HMO | Admitting: Family Medicine

## 2014-05-24 ENCOUNTER — Ambulatory Visit (INDEPENDENT_AMBULATORY_CARE_PROVIDER_SITE_OTHER)
Admission: RE | Admit: 2014-05-24 | Discharge: 2014-05-24 | Disposition: A | Discharge status: Home / Self Care | Payer: Medicare HMO | Source: Ambulatory Visit | Attending: Family Medicine | Admitting: Family Medicine

## 2014-05-24 VITALS — BP 166/90 | HR 81 | Temp 98.3°F | Ht <= 58 in | Wt 121.2 lb

## 2014-05-24 DIAGNOSIS — M79609 Pain in unspecified limb: Secondary | ICD-10-CM

## 2014-05-24 DIAGNOSIS — M79672 Pain in left foot: Secondary | ICD-10-CM

## 2014-05-24 MED ORDER — COLCHICINE-PROBENECID 0.5-500 MG PO TABS: 1.0000 | ORAL_TABLET | Freq: Two times a day (BID) | ORAL | Status: DC

## 2014-05-24 MED ORDER — DICLOFENAC SODIUM 75 MG PO TBEC: 75.0000 mg | DELAYED_RELEASE_TABLET | Freq: Two times a day (BID) | ORAL | Status: DC

## 2014-05-24 NOTE — Progress Notes (Signed)
Pre visit review using our clinic review tool, if applicable. No additional management support is needed unless otherwise documented below in the visit note. 

## 2014-05-24 NOTE — Progress Notes (Signed)
Dr. Frederico Hamman T. Karilynn Carranza, MD Primary Care and Sports Medicine [place], [address] Phone: [phone] Fax: [phone]  05/24/2014  Patient: Megan Hobbs, MRN: [id number], DOB: [birth year]  Primary Physician:  Arnette Norris, MD  Chief Complaint: no improvement, Foot Pain and Thrush  Subjective:   History of Present Illness:  Megan Hobbs is a 73 y.o. very pleasant female patient who presents with the following:  Left foot pain, initially thought the patient was having some metatarsalgia and pain with injury to the capsule. She is here today and continues to have some foot pain, and she is having some minimal degree of redness and possibly a little bit of some warmth.   She also has oral thrush, and this is improving on some recent medication.  Past Medical History, Surgical History, Social History, Family History, Problem List, Medications, and Allergies have been reviewed and updated if relevant.  Review of Systems:  GEN: No fevers, chills. Nontoxic. Primarily MSK c/o today. MSK: Detailed in the HPI GI: tolerating PO intake without difficulty Neuro: No numbness, parasthesias, or tingling associated. Otherwise the pertinent positives of the ROS are noted above.   Objective:   Physical Examination: BP 166/90  Pulse 81  Temp(Src) 98.3 F (36.8 C) (Oral)  Ht 4\' 9"  (1.448 m)  Wt 121 lb 4 oz (54.999 kg)  BMI 26.23 kg/m2  SpO2 97%   GEN: WDWN, NAD, Non-toxic, Alert & Oriented x 3 HEENT: Atraumatic, Normocephalic.  Ears and Nose: No external deformity. EXTR: No clubbing/cyanosis/edema NEURO: Normal gait.  PSYCH: Normally interactive. Conversant. Not depressed or anxious appearing.  Calm demeanor.   FEET: LEFT Echymosis: no Edema: no ROM: full LE B Gait: heel toe, non-antalgic MT pain: 2ND AND 3RD Callus pattern: none Lateral Mall: NT Medial Mall: NT Talus: NT Navicular: NT Cuboid: NT Calcaneous: NT 5th MT: NT Phalanges: NT Achilles:  NT Plantar Fascia: NT Fat Pad: NT Peroneals: NT Post Tib: NT Great Toe: Nml motion Ant Drawer: neg ATFL: NT CFL: NT Deltoid: NT Other foot breakdown: none Long arch: preserved Transverse arch: preserved Hindfoot breakdown: none Sensation: intact   Radiology: Dg Foot Complete Left  05/24/2014   CLINICAL DATA:  Pain  EXAM: LEFT FOOT - COMPLETE 3+ VIEW  COMPARISON:  January 16, 2014  FINDINGS: Frontal, oblique, and lateral views were obtained. There is no fracture or dislocation. Joint spaces appear intact. No erosive change.  IMPRESSION: No abnormality noted.   Electronically Signed   By: Lowella Grip M.D.   On: 05/24/2014 12:31    Assessment and Plan:   Foot pain, left - Plan: DG Foot Complete Left  This may be new onset gout. I will treat as such and treat with diclofenac as well as colchicine.  Ritta Slot continues, but it is improving somewhat on her current medication list. Diflucan.  Follow-up: No Follow-up on file.  New Prescriptions   COLCHICINE-PROBENECID 0.5-500 MG PER TABLET    Take 1 tablet by mouth 2 (two) times daily.   DICLOFENAC (VOLTAREN) 75 MG EC TABLET    Take 1 tablet (75 mg total) by mouth 2 (two) times daily.   Orders Placed This Encounter  Procedures  . DG Foot Complete Left    Signed,  Jeryn Cerney T. Sania Noy, MD, [place]   Patient's Medications  New Prescriptions   COLCHICINE-PROBENECID 0.5-500 MG PER TABLET    Take 1 tablet by mouth 2 (two) times daily.   DICLOFENAC (VOLTAREN) 75 MG EC TABLET  Take 1 tablet (75 mg total) by mouth 2 (two) times daily.  Previous Medications   ALPRAZOLAM (XANAX) 0.25 MG TABLET    TAKE ONE-HALF TO ONE TABLET BY MOUTH EVERY DAY AS NEEDED   ALUM & MAG HYDROXIDE-SIMETH (MAGIC MOUTHWASH W/LIDOCAINE) SOLN    Take 5 mLs by mouth 4 (four) times daily as needed for mouth pain. Mix: 80 ml maalox, 80 ml benadryl susp, 80 mL lidocaine 1%   ASPIRIN 81 MG TABLET    Take 81 mg by mouth daily.     ATORVASTATIN  (LIPITOR) 20 MG TABLET    TAKE ONE TABLET BY MOUTH ONCE DAILY   AUGMENTED BETAMETHASONE DIPROPIONATE (DIPROLENE-AF) 0.05 % OINTMENT    USE AS DIRECTED ON SCALP   B COMPLEX-C (SUPER B COMPLEX PO)    Take 1 tablet by mouth daily.     BIOTIN 5000 PO    Take 10,000 mEq by mouth daily.     CALCIUM CARBONATE-VITAMIN D (CALCIUM + D PO)    Take 1 tablet by mouth daily.     CHOLECALCIFEROL (VITAMIN D-3) 5000 UNITS TABS    Take 1 tablet by mouth daily.     CYANOCOBALAMIN (B-12) 500 MCG TABS    Take 1 tablet by mouth daily. 1000 UNITS   ESOMEPRAZOLE (NEXIUM) 40 MG CAPSULE    Take 1 capsule (40 mg total) by mouth daily.   FISH OIL OIL    Take 1 tablet by mouth 2 (two) times daily.     FLUTICASONE (FLONASE) 50 MCG/ACT NASAL SPRAY    Place 1 spray into both nostrils daily as needed for allergies or rhinitis.   FOLIC ACID (FOLVITE) 706 MCG TABLET    Take 400 mcg by mouth daily.     LORATADINE (CLARITIN) 10 MG TABLET    Take 10 mg by mouth daily as needed for allergies.   MULTIPLE MINERALS-VITAMINS (CALCIUM CITRATE +) TABS    Take 1 tablet by mouth 2 (two) times daily.     MULTIPLE VITAMIN (MULTIVITAMIN) TABLET    Take 1 tablet by mouth daily.     NYSTATIN (MYCOSTATIN) 100000 UNIT/ML SUSPENSION    Take 5 mLs (500,000 Units total) by mouth 4 (four) times daily.   PREDNISONE (DELTASONE) 20 MG TABLET    2 tabs po for 4 days, then 1 tab po for 4 days   SELENIUM SULFIDE (SELSUN) 2.5 % SHAMPOO    Apply topically daily as needed. Use as directed on scalp   SERTRALINE (ZOLOFT) 50 MG TABLET    TAKE ONE TABLET BY MOUTH EVERY DAY   ZOLPIDEM (AMBIEN) 5 MG TABLET    Take 1 tablet (5 mg total) by mouth at bedtime as needed for sleep.  Modified Medications   No medications on file  Discontinued Medications   FLUCONAZOLE (DIFLUCAN) 200 MG TABLET    2 tabs po on 1st day, then 1 tab po daily

## 2014-05-25 ENCOUNTER — Telehealth: Payer: Self-pay | Admitting: *Deleted

## 2014-05-25 ENCOUNTER — Encounter: Payer: Self-pay | Admitting: *Deleted

## 2014-05-25 MED ORDER — COLCHICINE 0.6 MG PO TABS: 0.6000 mg | ORAL_TABLET | Freq: Two times a day (BID) | ORAL | Status: DC

## 2014-05-25 NOTE — Telephone Encounter (Signed)
Received fax from [facility] about a Drug Interaction with diclofenac and proben/cholc 500/.5 mg that was prescribed for Ms. Megan [place]bs. Per Dr. Lorelei Pont, will change to colcrys 0.6 mg take one tablet by mouth two times a day.  She is also to take her Diclofenac 75 mg one tablet by mouth two times a day.  Prescription called to [facility] (pharmacist) at [facility].

## 2014-05-25 NOTE — Telephone Encounter (Signed)
This encounter was created in error - please disregard.

## 2014-05-28 ENCOUNTER — Ambulatory Visit: Payer: Medicare HMO | Admitting: Family Medicine

## 2014-05-28 ENCOUNTER — Other Ambulatory Visit: Payer: Self-pay | Admitting: *Deleted

## 2014-05-28 MED ORDER — ESOMEPRAZOLE MAGNESIUM 40 MG PO CPDR: 40.0000 mg | DELAYED_RELEASE_CAPSULE | Freq: Every day | ORAL | Status: AC

## 2014-05-28 NOTE — Telephone Encounter (Signed)
Received faxed refill request from pharmacy. Refill sent to pharmacy electronically. 

## 2014-06-14 ENCOUNTER — Ambulatory Visit (INDEPENDENT_AMBULATORY_CARE_PROVIDER_SITE_OTHER): Payer: Medicare HMO | Admitting: Family Medicine

## 2014-06-14 ENCOUNTER — Telehealth: Payer: Self-pay

## 2014-06-14 ENCOUNTER — Encounter: Payer: Self-pay | Admitting: Family Medicine

## 2014-06-14 VITALS — BP 150/80 | HR 100 | Temp 98.2°F | Ht <= 58 in | Wt 120.0 lb

## 2014-06-14 DIAGNOSIS — D649 Anemia, unspecified: Secondary | ICD-10-CM

## 2014-06-14 DIAGNOSIS — E785 Hyperlipidemia, unspecified: Secondary | ICD-10-CM

## 2014-06-14 DIAGNOSIS — F4321 Adjustment disorder with depressed mood: Secondary | ICD-10-CM

## 2014-06-14 LAB — CBC WITH DIFFERENTIAL/PLATELET
BASOS PCT: 0.2 % (ref 0.0–3.0)
Basophils Absolute: 0 10*3/uL (ref 0.0–0.1)
Eosinophils Absolute: 0.1 10*3/uL (ref 0.0–0.7)
Eosinophils Relative: 1 % (ref 0.0–5.0)
HCT: 28.5 % — ABNORMAL LOW (ref 36.0–46.0)
Lymphocytes Relative: 36.2 % (ref 12.0–46.0)
Lymphs Abs: 2 10*3/uL (ref 0.7–4.0)
MCHC: 29.2 g/dL — ABNORMAL LOW (ref 30.0–36.0)
MCV: 64.3 fl — ABNORMAL LOW (ref 78.0–100.0)
MONOS PCT: 7.7 % (ref 3.0–12.0)
Monocytes Absolute: 0.4 10*3/uL (ref 0.1–1.0)
NEUTROS ABS: 3 10*3/uL (ref 1.4–7.7)
Neutrophils Relative %: 54.9 % (ref 43.0–77.0)
Platelets: 320 10*3/uL (ref 150.0–400.0)
RBC: 4.43 Mil/uL (ref 3.87–5.11)
RDW: 20.5 % — ABNORMAL HIGH (ref 11.5–15.5)
WBC: 5.5 10*3/uL (ref 4.0–10.5)

## 2014-06-14 LAB — FERRITIN: Ferritin: 6.2 ng/mL — ABNORMAL LOW (ref 10.0–291.0)

## 2014-06-14 LAB — IBC PANEL
Iron: 32 ug/dL — ABNORMAL LOW (ref 42–145)
Saturation Ratios: 5.4 % — ABNORMAL LOW (ref 20.0–50.0)
TRANSFERRIN: 423.2 mg/dL — AB (ref 212.0–360.0)

## 2014-06-14 LAB — LDL CHOLESTEROL, DIRECT: LDL DIRECT: 89.2 mg/dL

## 2014-06-14 LAB — VITAMIN B12: VITAMIN B 12: 1146 pg/mL — AB (ref 211–911)

## 2014-06-14 MED ORDER — FIRST-BXN MOUTHWASH MT SUSP: OROMUCOSAL | Status: DC

## 2014-06-14 NOTE — Progress Notes (Signed)
Subjective:   Patient ID: Megan Hobbs, female    DOB: 1941/07/02, 73 y.o.   MRN: [id number]  Megan Hobbs is a pleasant 73 y.o. year old female who presents to clinic today with Follow-up  on 06/14/2014  HPI:  Unfortunately her grandson died a few weeks ago.  Was in [place] for his funeral and felt very weak. Went to a doctor there who told her she was very anemic and advised her to take OTC iron and follow up with me.  We did treat her for H pylori two months ago.  Was having worsening symptoms of GERD. Colonoscopy 06/02/11- Dr. Elvera Bicker. Has never had an endoscopy. No difficulty swallowing food.  No black stools that she is aware of.  No bright red blood per rectum.  She is taking Nexium 40 mg daily.  She is understandably very sad and anxious about her grandson's death.  Died at age 81 of an MI. She has been taking more xanax than how she is prescribed to take it. Having more panic attacks and not sleeping as well. No SI or HI- most worried about her daughter.  Current Outpatient Prescriptions on File Prior to Visit  Medication Sig Dispense Refill  . ALPRAZolam (XANAX) 0.25 MG tablet TAKE ONE-HALF TO ONE TABLET BY MOUTH EVERY DAY AS NEEDED  30 tablet  0  . atorvastatin (LIPITOR) 20 MG tablet TAKE ONE TABLET BY MOUTH ONCE DAILY  30 tablet  3  . B Complex-C (SUPER B COMPLEX PO) Take 1 tablet by mouth daily.        . Cholecalciferol (VITAMIN D-3) 5000 UNITS TABS Take 1 tablet by mouth daily.        . colchicine (COLCRYS) 0.6 MG tablet Take 1 tablet (0.6 mg total) by mouth 2 (two) times daily.  60 tablet  0  . Cyanocobalamin (B-12) 500 MCG TABS Take 1 tablet by mouth daily. 1000 UNITS      . esomeprazole (NEXIUM) 40 MG capsule Take 1 capsule (40 mg total) by mouth daily.  30 capsule  5  . fluticasone (FLONASE) 50 MCG/ACT nasal spray Place 1 spray into both nostrils daily as needed for allergies or rhinitis.      Marland Kitchen loratadine (CLARITIN) 10 MG tablet Take 10 mg by mouth  daily as needed for allergies.      . Multiple Vitamin (MULTIVITAMIN) tablet Take 1 tablet by mouth daily.        Marland Kitchen selenium sulfide (SELSUN) 2.5 % shampoo Apply topically daily as needed. Use as directed on scalp  118 mL  0  . sertraline (ZOLOFT) 50 MG tablet TAKE ONE TABLET BY MOUTH EVERY DAY  30 tablet  0  . zolpidem (AMBIEN) 5 MG tablet Take 1 tablet (5 mg total) by mouth at bedtime as needed for sleep.  30 tablet  0   No current facility-administered medications on file prior to visit.    Allergies  Allergen Reactions  . Amoxicillin-Pot Clavulanate     REACTION: rash all over  . Latex   . Ciprofloxacin Rash    Past Medical History  Diagnosis Date  . Depression   . Hyperlipidemia   . Osteopenia   . Allergy   . Anxiety     Past Surgical History  Procedure Laterality Date  . Appendectomy  1963    Family History  Problem Relation Age of Onset  . Stroke Father 46  . Pneumonia Mother   . Stroke Mother     RAD  .  Hypertension Brother     multiple brothers  . Hypertension Sister     multiple sisters  . Diabetes Daughter   . Diabetes Sister   . Cancer Sister     ? hysterectomy  . Cancer Sister     leukemia                                  History   Social History  . Marital Status: Widowed    Spouse Name: N/A    Number of Children: 2  . Years of Education: N/A   Occupational History  .  [employer]   Social History Main Topics  . Smoking status: Former Smoker    Types: Cigarettes  . Smokeless tobacco: Never Used     Comment: teens  . Alcohol Use: No  . Drug Use: No  . Sexual Activity: No     Comment: patient a widow   Other Topics Concern  . Not on file   Social History Narrative    2 daughters- grandchildren 9/09, daughter having heart problems, widowed 1/03   The PMH, PSH, Social History, Family History, Medications, and allergies have been reviewed in [facility], and have been updated if relevant.     Review of Systems See HPI No black stools No  nausea or vomiting +sour taste in her mouth in the morning No abdominal pain Appetite decreased but she feels this is due to grief- Wt Readings from Last 3 Encounters:  06/14/14 120 lb (54.432 kg)  05/24/14 121 lb 4 oz (54.999 kg)  05/14/14 122 lb 8 oz (55.566 kg)       Objective:    BP 150/80  Pulse 100  Temp(Src) 98.2 F (36.8 C) (Tympanic)  Ht 4\' 9"  (1.448 m)  Wt 120 lb (54.432 kg)  BMI 25.96 kg/m2  SpO2 97%   Physical Exam  Nursing note and vitals reviewed. Constitutional: She appears well-developed and well-nourished. No distress.  Cardiovascular: Normal rate and regular rhythm.   Pulmonary/Chest: Effort normal and breath sounds normal. No respiratory distress.  Abdominal: Soft. Bowel sounds are normal. She exhibits no distension. There is no tenderness. There is no rebound.  Skin: Skin is warm.  Psychiatric:  Tearful but appropriate          Assessment & Plan:   Anemia, unspecified anemia type - Plan: CBC with Differential, Ferritin, IBC Panel, Vitamin B12, Ambulatory referral to Gastroenterology  HYPERLIPIDEMIA - Plan: LDL Cholesterol, Direct No Follow-up on file.

## 2014-06-14 NOTE — Assessment & Plan Note (Signed)
New- will check labs today. ? Due to h.pylori. Continue PPI. Refer back to Dr. Fuller Plan for possible endoscopy. Orders Placed This Encounter  Procedures  . CBC with Differential  . Ferritin  . IBC Panel  . Vitamin B12  . LDL Cholesterol, Direct  . Ambulatory referral to Gastroenterology

## 2014-06-14 NOTE — Patient Instructions (Signed)
I am so sorry for your loss. We will call you with your lab results and your GI appointment.

## 2014-06-14 NOTE — Assessment & Plan Note (Signed)
Recheck direct LDL today. TG were too elevated at last check to calculate LDL.  She is now taking her lipitor daily.

## 2014-06-14 NOTE — Telephone Encounter (Signed)
Yes I am ok with this but pt specifically asked for this mouthwash so please make sure its ok with her.

## 2014-06-14 NOTE — Assessment & Plan Note (Signed)
New- feels she is coping the best she can. I have an appointment with her on Monday.  She will call me if her symptoms worsen before that time.

## 2014-06-14 NOTE — Telephone Encounter (Signed)
Brandy with [facility] [address] left v/m; mouthwash that was prescribed earlier today is no longer available and Megan Hobbs wants to know if can switch to Dukes mouth wash.Please advise.

## 2014-06-14 NOTE — Progress Notes (Signed)
Pre visit review using our clinic review tool, if applicable. No additional management support is needed unless otherwise documented below in the visit note. 

## 2014-06-15 ENCOUNTER — Other Ambulatory Visit: Payer: Self-pay | Admitting: Family Medicine

## 2014-06-15 MED ORDER — FERROUS SULFATE 325 (65 FE) MG PO TABS: 325.0000 mg | ORAL_TABLET | Freq: Two times a day (BID) | ORAL | Status: AC

## 2014-06-15 NOTE — Telephone Encounter (Signed)
Spoke to pharmacy and advised them ok to fill Dukes mouthwash versus original Rx. LM on pts vm advising her in the event she notices a name change

## 2014-06-18 ENCOUNTER — Other Ambulatory Visit: Payer: Self-pay

## 2014-06-18 ENCOUNTER — Ambulatory Visit (INDEPENDENT_AMBULATORY_CARE_PROVIDER_SITE_OTHER): Payer: Medicare HMO | Admitting: Family Medicine

## 2014-06-18 ENCOUNTER — Encounter: Payer: Self-pay | Admitting: Family Medicine

## 2014-06-18 VITALS — BP 120/70 | HR 80 | Temp 97.8°F | Ht <= 58 in | Wt 118.0 lb

## 2014-06-18 DIAGNOSIS — M949 Disorder of cartilage, unspecified: Secondary | ICD-10-CM

## 2014-06-18 DIAGNOSIS — Z0000 Encounter for general adult medical examination without abnormal findings: Secondary | ICD-10-CM

## 2014-06-18 DIAGNOSIS — F4321 Adjustment disorder with depressed mood: Secondary | ICD-10-CM

## 2014-06-18 DIAGNOSIS — Z1231 Encounter for screening mammogram for malignant neoplasm of breast: Secondary | ICD-10-CM

## 2014-06-18 DIAGNOSIS — E785 Hyperlipidemia, unspecified: Secondary | ICD-10-CM

## 2014-06-18 DIAGNOSIS — D509 Iron deficiency anemia, unspecified: Secondary | ICD-10-CM

## 2014-06-18 DIAGNOSIS — M899 Disorder of bone, unspecified: Secondary | ICD-10-CM

## 2014-06-18 DIAGNOSIS — Z23 Encounter for immunization: Secondary | ICD-10-CM

## 2014-06-18 LAB — COMPREHENSIVE METABOLIC PANEL
ALT: 37 U/L — ABNORMAL HIGH (ref 0–35)
AST: 33 U/L (ref 0–37)
Albumin: 4.1 g/dL (ref 3.5–5.2)
Alkaline Phosphatase: 66 U/L (ref 39–117)
BUN: 13 mg/dL (ref 6–23)
CALCIUM: 9.3 mg/dL (ref 8.4–10.5)
CHLORIDE: 105 meq/L (ref 96–112)
CO2: 28 meq/L (ref 19–32)
Creatinine, Ser: 0.6 mg/dL (ref 0.4–1.2)
GFR: 108.46 mL/min (ref >60.00)
Glucose, Bld: 107 mg/dL — ABNORMAL HIGH (ref 70–99)
Potassium: 3.9 mEq/L (ref 3.5–5.1)
Sodium: 141 mEq/L (ref 135–145)
Total Bilirubin: 0.3 mg/dL (ref 0.2–1.2)
Total Protein: 7.6 g/dL (ref 6.0–8.3)

## 2014-06-18 LAB — CBC WITH DIFFERENTIAL/PLATELET
BASOS ABS: 0 10*3/uL (ref 0.0–0.1)
Basophils Relative: 0.4 % (ref 0.0–3.0)
Eosinophils Absolute: 0.1 10*3/uL (ref 0.0–0.7)
Eosinophils Relative: 2.3 % (ref 0.0–5.0)
HEMATOCRIT: 29.6 % — AB (ref 36.0–46.0)
Hemoglobin: 8.6 g/dL — ABNORMAL LOW (ref 12.0–15.0)
Lymphocytes Relative: 36.4 % (ref 12.0–46.0)
Lymphs Abs: 1.6 10*3/uL (ref 0.7–4.0)
MCHC: 28.9 g/dL — ABNORMAL LOW (ref 30.0–36.0)
MCV: 65.1 fl — ABNORMAL LOW (ref 78.0–100.0)
MONOS PCT: 9.8 % (ref 3.0–12.0)
Monocytes Absolute: 0.4 10*3/uL (ref 0.1–1.0)
Neutro Abs: 2.2 10*3/uL (ref 1.4–7.7)
Neutrophils Relative %: 51.1 % (ref 43.0–77.0)
Platelets: 284 10*3/uL (ref 150.0–400.0)
RBC: 4.55 Mil/uL (ref 3.87–5.11)
RDW: 21.3 % — AB (ref 11.5–15.5)
WBC: 4.3 10*3/uL (ref 4.0–10.5)

## 2014-06-18 LAB — TSH: TSH: 1.22 u[IU]/mL (ref 0.35–4.50)

## 2014-06-18 MED ORDER — ZOLPIDEM TARTRATE 5 MG PO TABS: 5.0000 mg | ORAL_TABLET | Freq: Every evening | ORAL | Status: DC | PRN

## 2014-06-18 MED ORDER — ATORVASTATIN CALCIUM 20 MG PO TABS: ORAL_TABLET | ORAL | Status: AC

## 2014-06-18 MED ORDER — SERTRALINE HCL 50 MG PO TABS: ORAL_TABLET | ORAL | Status: AC

## 2014-06-18 MED ORDER — ALPRAZOLAM 0.25 MG PO TABS: ORAL_TABLET | ORAL | Status: DC

## 2014-06-18 MED ORDER — COLCHICINE 0.6 MG PO TABS: 0.6000 mg | ORAL_TABLET | Freq: Two times a day (BID) | ORAL | Status: DC

## 2014-06-18 NOTE — Assessment & Plan Note (Signed)
Due for bone density. Order placed today.

## 2014-06-18 NOTE — Progress Notes (Signed)
Subjective:   Patient ID: Megan Hobbs, female    DOB: June 11, 1941, 73 y.o.   MRN: [id number]  Megan Hobbs is a pleasant 73 y.o. year old female who presents to clinic today with Annual Exam  on 06/18/2014  HPI:  I have personally reviewed the Medicare Annual Wellness questionnaire and have noted 1. The patient's medical and social history 2. Their use of alcohol, tobacco or illicit drugs 3. Their current medications and supplements 4. The patient's functional ability including ADL's, fall risks, home safety risks and hearing or visual             impairment. 5. Diet and physical activities 6. Evidence for depression or mood disorders  End of life wishes discussed and updated in Social History.  The roster of all physicians providing medical care to patient - is listed in the Snapshot section of the chart.  DEXA 07/18/12- osteopenia Zoster 03/09/11 Td 02/02/08 Pneumovax 07/11/12 Mammogram 08/08/2012   Unfortunately her grandson died a few weeks ago.  Was in [place] for his funeral and felt very weak. Went to a doctor there who told her she was very anemic and advised her to take OTC iron and follow up with me.  We did treat her for H pylori two months ago.  Was having worsening symptoms of GERD. Colonoscopy 06/02/11- Dr. Elvera Bicker. Has never had an endoscopy. No difficulty swallowing food.  No black stools that she is aware of.  No bright red blood per rectum.  She is taking Nexium 40 mg daily.  I checked a CBC- she is anemic- started oral ferrous sulfate 325 mg twice daily. Also referred her to hematology and back to GI. LMP 30 + years ago- no postmenopausal bleeding.  Lab Results  Component Value Date   WBC 5.5 06/14/2014   HGB 8.3 Repeated and verified X2.* 06/14/2014   HCT 28.5* 06/14/2014   MCV 64.3 Repeated and verified X2.* 06/14/2014   PLT 320.0 06/14/2014    She is understandably very sad and anxious about her grandson's death.  Died at age 73 of an  MI. She has been taking more xanax than how she is prescribed to take it- taking 1 tablet twice daily for sleep and anxiety.  Having more panic attacks and not sleeping as well. No SI or HI- most worried about her daughter. Also taking Zoloft 50 mg daily.  HLD- on lipitor 20 mg daily. Lab Results  Component Value Date   LDLCALC 93 07/11/2012     Current Outpatient Prescriptions on File Prior to Visit  Medication Sig Dispense Refill  . ALPRAZolam (XANAX) 0.25 MG tablet TAKE ONE-HALF TO ONE TABLET BY MOUTH EVERY DAY AS NEEDED  30 tablet  0  . atorvastatin (LIPITOR) 20 MG tablet TAKE ONE TABLET BY MOUTH ONCE DAILY  30 tablet  3  . B Complex-C (SUPER B COMPLEX PO) Take 1 tablet by mouth daily.        . Cholecalciferol (VITAMIN D-3) 5000 UNITS TABS Take 1 tablet by mouth daily.        . colchicine (COLCRYS) 0.6 MG tablet Take 1 tablet (0.6 mg total) by mouth 2 (two) times daily.  60 tablet  0  . Cyanocobalamin (B-12) 500 MCG TABS Take 1 tablet by mouth daily. 1000 UNITS      . Diphenhyd-Lidocaine-Nystatin (FIRST-BXN MOUTHWASH) SUSP Swish and spit with 1 teaspoonful four times a day  237 mL  0  . esomeprazole (NEXIUM) 40 MG capsule Take 1 capsule (  40 mg total) by mouth daily.  30 capsule  5  . ferrous sulfate 325 (65 FE) MG tablet Take 1 tablet (325 mg total) by mouth 2 (two) times daily with a meal.  60 tablet  3  . fluticasone (FLONASE) 50 MCG/ACT nasal spray Place 1 spray into both nostrils daily as needed for allergies or rhinitis.      Marland Kitchen loratadine (CLARITIN) 10 MG tablet Take 10 mg by mouth daily as needed for allergies.      . Multiple Vitamin (MULTIVITAMIN) tablet Take 1 tablet by mouth daily.        Marland Kitchen selenium sulfide (SELSUN) 2.5 % shampoo Apply topically daily as needed. Use as directed on scalp  118 mL  0  . sertraline (ZOLOFT) 50 MG tablet TAKE ONE TABLET BY MOUTH EVERY DAY  30 tablet  0  . zolpidem (AMBIEN) 5 MG tablet Take 1 tablet (5 mg total) by mouth at bedtime as needed for  sleep.  30 tablet  0   No current facility-administered medications on file prior to visit.    Allergies  Allergen Reactions  . Amoxicillin-Pot Clavulanate     REACTION: rash all over  . Latex   . Ciprofloxacin Rash    Past Medical History  Diagnosis Date  . Depression   . Hyperlipidemia   . Osteopenia   . Allergy   . Anxiety     Past Surgical History  Procedure Laterality Date  . Appendectomy  1963    Family History  Problem Relation Age of Onset  . Stroke Father 27  . Pneumonia Mother   . Stroke Mother     RAD  . Hypertension Brother     multiple brothers  . Hypertension Sister     multiple sisters  . Diabetes Daughter   . Diabetes Sister   . Cancer Sister     ? hysterectomy  . Cancer Sister     leukemia                                  History   Social History  . Marital Status: Widowed    Spouse Name: N/A    Number of Children: 2  . Years of Education: N/A   Occupational History  .  [employer]   Social History Main Topics  . Smoking status: Former Smoker    Types: Cigarettes  . Smokeless tobacco: Never Used     Comment: teens  . Alcohol Use: No  . Drug Use: No  . Sexual Activity: No     Comment: patient a widow   Other Topics Concern  . Not on file   Social History Narrative    2 daughters- grandchildren 9/09, daughter having heart problems, widowed 1/03   The PMH, PSH, Social History, Family History, Medications, and allergies have been reviewed in [facility], and have been updated if relevant.     Review of Systems See HPI No black stools No nausea or vomiting +sour taste in her mouth in the morning No abdominal pain Appetite decreased but she feels this is due to grief- Wt Readings from Last 3 Encounters:  06/18/14 118 lb (53.524 kg)  06/14/14 120 lb (54.432 kg)  05/24/14 121 lb 4 oz (54.999 kg)   Denies SI or HI No CP No SOB +insomnia +anxiety    Objective:    BP 120/70  Pulse 80  Temp(Src) 97.8 F (36.6 C) (  Oral)  Ht  4' 8.5" (1.435 m)  Wt 118 lb (53.524 kg)  BMI 25.99 kg/m2  SpO2 98%   Physical Exam  Nursing note and vitals reviewed. Constitutional: She appears well-developed and well-nourished. No distress.  Cardiovascular: Normal rate and regular rhythm.   Pulmonary/Chest: Effort normal and breath sounds normal. No respiratory distress. Right breast exhibits no inverted nipple, no mass and no nipple discharge. Left breast exhibits no inverted nipple, no mass, no nipple discharge, no skin change and no tenderness. Breasts are symmetrical.  Abdominal: Soft. Bowel sounds are normal. She exhibits no distension. There is no tenderness. There is no rebound.  Skin: Skin is warm.  Psychiatric:  Tearful but appropriate          Assessment & Plan:   Microcytic anemia  Medicare annual wellness visit, subsequent  Grief  HYPERLIPIDEMIA No Follow-up on file.

## 2014-06-18 NOTE — Assessment & Plan Note (Addendum)
The patients weight, height, BMI and visual acuity have been recorded in the chart I have made referrals, counseling and provided education to the patient based review of the above and I have provided the pt with a written personalized care plan for preventive services.  Prevnar 13 and influenza immunizations today. Mammogram ordered. Pt will call to set up.

## 2014-06-18 NOTE — Telephone Encounter (Signed)
Pt left v/m; pt was seen earlier today and request refill for colcrys and omeprazole.Please advise.Omeprazole is not on current med list.[facility] [address].

## 2014-06-18 NOTE — Progress Notes (Signed)
Pre visit review using our clinic review tool, if applicable. No additional management support is needed unless otherwise documented below in the visit note. 

## 2014-06-18 NOTE — Addendum Note (Signed)
Addended by: Ellamae Sia on: 06/18/2014 12:11 PM   Modules accepted: Orders

## 2014-06-18 NOTE — Assessment & Plan Note (Signed)
New- consistent with iron deficiency anemia. She has not yet picked up rx for ferrous sulfate 325 mg twice daily. Referral placed for hematology. Also referred back to GI. Will ordered IFOB today as well.

## 2014-06-18 NOTE — Assessment & Plan Note (Signed)
LDL well controlled. No changes.

## 2014-06-18 NOTE — Addendum Note (Signed)
Addended by: Modena Nunnery on: 06/18/2014 10:22 AM   Modules accepted: Orders

## 2014-06-18 NOTE — Patient Instructions (Addendum)
Great to see you. Please start taking your oral iron- 1 tablet twice daily with meals.  We are referring you to a blood specialist.  Please pick up your stool cards.  Please call to set up your mammogram.  I will call you with your lab results.

## 2014-06-18 NOTE — Assessment & Plan Note (Signed)
Persistent but appropriate. Will increase dose of xanax to 1 tab twice daily prn anxiety/insomnia for now- she is aware this is short term. Continue current dose zoloft.

## 2014-06-21 ENCOUNTER — Telehealth: Payer: Self-pay | Admitting: Family Medicine

## 2014-06-21 NOTE — Telephone Encounter (Signed)
Patient Information:  Caller Name: Lanyiah  Phone: [phone]  Patient: Megan Hobbs, Megan Hobbs  Gender: Female  DOB: 06/27/1941  Age: 73 Years  PCP: Arnette [facility])  Office Follow Up:  Does the office need to follow up with this patient?: No  Instructions For The Office: N/A  RN Note:  Pt is calling to verify that she is to begin taking Iron.  Per epic office note on 9/14, yes begin taking Ferrous Sultfate twice a day.  Second question has referral to GI and Hematology been made?  Yes per Epic GI called and left message today for pt to call back to schedule and Hemotology referral has also been made.  Pt states that she will follow up with both and start medication  Symptoms  Reason For Call & Symptoms: Medication question and referral questions  Reviewed Health History In EMR: No  Reviewed Medications In EMR: No  Reviewed Allergies In EMR: No  Reviewed Surgeries / Procedures: Yes  Date of Onset of Symptoms: 06/21/2014  Guideline(s) Used:  No Protocol Available - Information Only  Disposition Per Guideline:   Home Care  Reason For Disposition Reached:   Information only question and nurse able to answer  Advice Given:  N/A  Patient Will Follow Care Advice:  YES

## 2014-06-28 ENCOUNTER — Encounter: Payer: Self-pay | Admitting: Family Medicine

## 2014-06-28 ENCOUNTER — Other Ambulatory Visit: Payer: Self-pay

## 2014-06-28 MED ORDER — FIRST-BXN MOUTHWASH MT SUSP: OROMUCOSAL | Status: AC

## 2014-06-28 NOTE — Telephone Encounter (Signed)
Pt left v/m requesting status of refill but pt did not leave a name of med; tried to contact pt but unable to reach pt; spoke with Crystal at [address] and she said was refill on dukes mouthwash.Please advise.

## 2014-07-09 ENCOUNTER — Telehealth: Payer: Self-pay | Admitting: Family Medicine

## 2014-07-09 ENCOUNTER — Ambulatory Visit: Payer: Self-pay | Admitting: Internal Medicine

## 2014-07-09 ENCOUNTER — Encounter: Payer: Self-pay | Admitting: Physician Assistant

## 2014-07-09 LAB — CANCER CENTER HEMOGLOBIN: HGB: 12.5 g/dL (ref 12.0–16.0)

## 2014-07-09 LAB — SGOT (AST)(ARMC): SGOT(AST): 51 U/L — ABNORMAL HIGH (ref 15–37)

## 2014-07-09 LAB — ALT: SGPT (ALT): 98 U/L — ABNORMAL HIGH

## 2014-07-09 NOTE — Telephone Encounter (Signed)
Error

## 2014-07-09 NOTE — Telephone Encounter (Signed)
Dr Inez Pilgrim Hematologist at [facility] called to give you a message about your patient. He said he saw her today and did her Hemaglobin and it was 12.5 today so he didn't feel like she needed a Hem Consult so he didn't do a full one at all. He also repeated her LFT's and they were elevated so he faxed you a copy of his office note and the LFT labs, We were able to get Megan Hobbs in to see [facility] next Monday 07/16/14 at 8:45am. His office note and lab results are in your in box. Please have them STAT scanned so the results will be seen for her GI referral.

## 2014-07-10 NOTE — Telephone Encounter (Signed)
Noted.  Still needs GI work up.

## 2014-07-16 ENCOUNTER — Other Ambulatory Visit: Payer: Self-pay | Admitting: *Deleted

## 2014-07-16 ENCOUNTER — Ambulatory Visit (INDEPENDENT_AMBULATORY_CARE_PROVIDER_SITE_OTHER): Payer: Commercial Managed Care - HMO | Admitting: Physician Assistant

## 2014-07-16 ENCOUNTER — Encounter: Payer: Self-pay | Admitting: Physician Assistant

## 2014-07-16 VITALS — BP 144/86 | HR 84 | Ht <= 58 in | Wt 120.4 lb

## 2014-07-16 DIAGNOSIS — D509 Iron deficiency anemia, unspecified: Secondary | ICD-10-CM

## 2014-07-16 MED ORDER — MOVIPREP 100 G PO SOLR: 1.0000 | ORAL | Status: DC

## 2014-07-16 MED ORDER — COLCHICINE 0.6 MG PO TABS: 0.6000 mg | ORAL_TABLET | Freq: Two times a day (BID) | ORAL | Status: DC

## 2014-07-16 MED ORDER — ALPRAZOLAM 0.25 MG PO TABS: ORAL_TABLET | ORAL | Status: DC

## 2014-07-16 MED ORDER — ZOLPIDEM TARTRATE 5 MG PO TABS: 5.0000 mg | ORAL_TABLET | Freq: Every evening | ORAL | Status: DC | PRN

## 2014-07-16 NOTE — Telephone Encounter (Signed)
Pt requesting medication refill. Last f/u appt 06/2014-CPE. Ok to fill per Dr Deborra Medina. Rx to be faxed to requested pharmacy before end of day, today.

## 2014-07-16 NOTE — Progress Notes (Signed)
Subjective:    Patient ID: Megan Hobbs, female    DOB: 06-08-1941, 73 y.o.   MRN: [id number]  HPI  Megan Hobbs  is a pleasant 73 year old [place] female referred by Dr. Arnette Norris today for evaluation of new finding of iron deficiency anemia. She is known to Dr. Fuller Plan from colonoscopy done in 2012. At that time she had 2 small polyps removed path on both of these consistent with hyperplastic polyps. She was also noted to have left colon diverticulosis. Patient has history of right not, osteopenia, hyperlipidemia, and depression. She also relates history of chronic GERD for which she is on Nexium. She says her current symptoms started a couple of months ago. When she developed a sore throat and what sounds like aphthous ulcers in her mouth which she says are very painful. She was initially given a course of nystatin and then Magic mouthwash eventually these resolved. She  says she lost her grandson in August and around that same time was having problems with significant . fatigue and decreased appetite. She was in [place] at that time and had seen a doctor there and was told that she was anemic and was referred back to her PCP here. Labs from mid August her hemoglobin of 8.3 hematocrit of 28.5 prior labs about 5 years ago hemoglobin was 13.9. She has been placed on iron supplement and last labs here was done September 14 hemoglobin up to 8.6 MCV 65 ferritin 6.2 iron 32 and set of 5.4 B12 level normal H. pylori antibody was done and was indeterminate. Family history negative for colon cancer polyps. She denies any problems with heartburn or indigestion as well she is on Nexium no dysphagia. No complaints of abdominal pain or changes in her bowel habits. She says her stools are dark on iron but she has not noted any melena or hematochezia. She has not yet done stool cards which she has at home. She had been referred to hematologist Dr. Inez Pilgrim in [place] and she tells me her last hemoglobin was up  to 12.5 and she was told that her liver tests were elevated as well.    Review of Systems  Constitutional: Positive for fatigue.  HENT: Negative.   Eyes: Negative.   Respiratory: Negative.   Cardiovascular: Negative.   Gastrointestinal: Negative.   Endocrine: Negative.   Genitourinary: Negative.   Musculoskeletal: Negative.   Skin: Negative.   Allergic/Immunologic: Negative.   Neurological: Negative.   Hematological: Negative.   Psychiatric/Behavioral: Negative.    Outpatient Prescriptions Prior to Visit  Medication Sig Dispense Refill  . ALPRAZolam (XANAX) 0.25 MG tablet 1 tab by mouth twice daily as needed for insomnia and anxiety  60 tablet  0  . atorvastatin (LIPITOR) 20 MG tablet TAKE ONE TABLET BY MOUTH ONCE DAILY  90 tablet  3  . B Complex-C (SUPER B COMPLEX PO) Take 1 tablet by mouth daily.        . Cholecalciferol (VITAMIN D-3) 5000 UNITS TABS Take 1 tablet by mouth daily.        . colchicine (COLCRYS) 0.6 MG tablet Take 1 tablet (0.6 mg total) by mouth 2 (two) times daily.  60 tablet  0  . Cyanocobalamin (B-12) 500 MCG TABS Take 1 tablet by mouth daily. 1000 UNITS      . Diphenhyd-Lidocaine-Nystatin (FIRST-BXN MOUTHWASH) SUSP Swish and spit with 1 teaspoonful four times a day  237 mL  0  . esomeprazole (NEXIUM) 40 MG capsule Take 1 capsule (40  mg total) by mouth daily.  30 capsule  5  . ferrous sulfate 325 (65 FE) MG tablet Take 1 tablet (325 mg total) by mouth 2 (two) times daily with a meal.  60 tablet  3  . fluticasone (FLONASE) 50 MCG/ACT nasal spray Place 1 spray into both nostrils daily as needed for allergies or rhinitis.      . Multiple Vitamin (MULTIVITAMIN) tablet Take 1 tablet by mouth daily.        Marland Kitchen selenium sulfide (SELSUN) 2.5 % shampoo Apply topically daily as needed. Use as directed on scalp  118 mL  0  . sertraline (ZOLOFT) 50 MG tablet TAKE ONE TABLET BY MOUTH EVERY DAY  90 tablet  3  . zolpidem (AMBIEN) 5 MG tablet Take 1 tablet (5 mg total) by mouth  at bedtime as needed for sleep.  30 tablet  0  . loratadine (CLARITIN) 10 MG tablet Take 10 mg by mouth daily as needed for allergies.       No facility-administered medications prior to visit.   Allergies  Allergen Reactions  . Amoxicillin-Pot Clavulanate     REACTION: rash all over  . Latex   . Ciprofloxacin Rash   Patient Active Problem List   Diagnosis Date Noted  . Microcytic anemia 06/18/2014  . Medicare annual wellness visit, subsequent 06/18/2014  . Anemia 06/14/2014  . Grief 06/14/2014  . Oral thrush 01/25/2014  . Ear pain 12/11/2013  . Insomnia 12/11/2013  . Osteopenia 12/11/2013  . Raynaud's syndrome 10/30/2013  . Eczematous dermatitis of eyelid 10/30/2013  . Yeast vaginitis 03/11/2012  . Back pain 03/03/2012  . Hematuria 03/03/2012  . COUGH 12/18/2010  . INTERNAL DERANGEMENT, LEFT KNEE 10/15/2010  . POPLITEAL CYST, LEFT 10/15/2010  . CERUMEN IMPACTION, RECURRENT 01/13/2010  . CHEST PAIN 06/05/2009  . MUSCLE SPASM, TRAPEZIUS MUSCLE, RIGHT 04/30/2009  . LOCALIZED SUPERFICIAL SWELLING MASS OR LUMP 12/19/2008  . OTHER DRUG ALLERGY 09/20/2007  . ANKLE EDEMA 08/24/2007  . ALLERGIC RHINITIS 07/29/2007  . VAGINISMUS 07/29/2007  . G E R D 04/21/2007  . DISORDER, MENOPAUSAL NOS 04/21/2007  . Other Malaise and Fatigue 04/21/2007  . HYPERLIPIDEMIA 04/19/2007  . CARPAL TUNNEL SYNDROME 04/19/2007  . CERVICAL POLYP 04/19/2007  . OSTEOPENIA 04/19/2007   History  Substance Use Topics  . Smoking status: Former Smoker    Types: Cigarettes  . Smokeless tobacco: Never Used     Comment: teens  . Alcohol Use: No   family history includes Cancer in her sister; Diabetes in her daughter and sister; Heart disease in her father and mother; Hypertension in her brother and sister; Pneumonia in her mother; Prostate cancer in her brother; Stroke in her mother; Stroke (age of onset: 103) in her father. There is no history of Colon cancer or Colon polyps.    Objective:   Physical  Exam  Well-developed older Hispanic female in no acute distress, pleasant blood pressure 144/86 pulse 84 height 4 foot 8 weight 120. HEENT; nontraumatic normocephalic EOMI PERRLA sclera anicteric, Supple no JVD, Cardiovascular; regular rate and rhythm with S1-S2 no murmur or gallop, Pulmonary; clear, Abdomen; soft bowel sounds are present is no palpable mass or hepatosplenomegaly she is tender in the right lower quadrant, Rectal ;exam not done -declined,patient to complete Hemoccult cards, Extremities ;no clubbing cyanosis or edema skin warm and dry, Psych; mood and affect appropriate       Assessment & Plan:  #3 73 year old [place] female with new iron deficiency anemia with hemoglobin as  low as 8.3. Not documented to be Hemoccult-positive as yet (Hemoccults pending). Etiology not clear rule out upper versus lower source for chronic blood loss #2 history of hyperplastic colon polyps on colonoscopy 2012 #3 diverticulosis #4 chronic GERD #5 hyperlipidemia #6 osteopenia #7 depression  Plan; Continue Nexium for now-no with history of osteopenia may want to switch her to an H2 blocker and see if this will control her GERD Will schedule for colonoscopy and EGD with Dr. Fuller Plan. Procedures discussed in detail with the patient and she is agreeable to proceed Continue oral iron supplementation We'll obtain her records from the hematologist for review. If EGD and colon are unremarkable may need to consider capsule endoscopy for completeness

## 2014-07-16 NOTE — Progress Notes (Signed)
Reviewed and agree with management plan.  Malcolm T. Stark, MD FACG 

## 2014-07-16 NOTE — Patient Instructions (Signed)
Complete the stool studies tests. Continue the iron and Nexium.   You have been scheduled for a colonoscopy. Please follow written instructions given to you at your visit today.  Please pick up your prep kit at the pharmacy within the next 1-3 days. If you use inhalers (even only as needed), please bring them with you on the day of your procedure. Your physician has requested that you go to www.startemmi.com and enter the access code given to you at your visit today. This web site gives a general overview about your procedure. However, you should still follow specific instructions given to you by our office regarding your preparation for the procedure.

## 2014-07-17 ENCOUNTER — Other Ambulatory Visit (INDEPENDENT_AMBULATORY_CARE_PROVIDER_SITE_OTHER): Payer: Commercial Managed Care - HMO

## 2014-07-17 DIAGNOSIS — D509 Iron deficiency anemia, unspecified: Secondary | ICD-10-CM

## 2014-07-18 ENCOUNTER — Encounter: Payer: Self-pay | Admitting: *Deleted

## 2014-07-18 LAB — FECAL OCCULT BLOOD, IMMUNOCHEMICAL: FECAL OCCULT BLD: NEGATIVE

## 2014-07-24 ENCOUNTER — Encounter: Payer: Self-pay | Admitting: Gastroenterology

## 2014-08-05 ENCOUNTER — Ambulatory Visit: Payer: Self-pay | Admitting: Internal Medicine

## 2014-08-06 ENCOUNTER — Encounter: Payer: Self-pay | Admitting: Physician Assistant

## 2014-08-07 ENCOUNTER — Encounter: Payer: Commercial Managed Care - HMO | Admitting: Gastroenterology

## 2014-08-07 ENCOUNTER — Ambulatory Visit (AMBULATORY_SURGERY_CENTER): Payer: Commercial Managed Care - HMO | Admitting: Gastroenterology

## 2014-08-07 ENCOUNTER — Encounter: Payer: Self-pay | Admitting: Gastroenterology

## 2014-08-07 VITALS — BP 161/83 | HR 89 | Temp 98.2°F | Resp 20 | Ht <= 58 in | Wt 120.0 lb

## 2014-08-07 DIAGNOSIS — K219 Gastro-esophageal reflux disease without esophagitis: Secondary | ICD-10-CM

## 2014-08-07 DIAGNOSIS — D123 Benign neoplasm of transverse colon: Secondary | ICD-10-CM

## 2014-08-07 DIAGNOSIS — D509 Iron deficiency anemia, unspecified: Secondary | ICD-10-CM

## 2014-08-07 MED ORDER — SODIUM CHLORIDE 0.9 % IV SOLN: 500.0000 mL | INTRAVENOUS | Status: DC

## 2014-08-07 NOTE — Progress Notes (Signed)
Called to room to assist during endoscopic procedure.  Patient ID and intended procedure confirmed with present staff. Received instructions for my participation in the procedure from the performing physician.  

## 2014-08-07 NOTE — Patient Instructions (Signed)
YOU HAD AN ENDOSCOPIC PROCEDURE TODAY AT THE [facility]: Refer to the procedure report that was given to you for any specific questions about what was found during the examination.  If the procedure report does not answer your questions, please call your gastroenterologist to clarify.  If you requested that your care partner not be given the details of your procedure findings, then the procedure report has been included in a sealed envelope for you to review at your convenience later.  YOU SHOULD EXPECT: Some feelings of bloating in the abdomen. Passage of more gas than usual.  Walking can help get rid of the air that was put into your GI tract during the procedure and reduce the bloating. If you had a lower endoscopy (such as a colonoscopy or flexible sigmoidoscopy) you may notice spotting of blood in your stool or on the toilet paper. If you underwent a bowel prep for your procedure, then you may not have a normal bowel movement for a few days.  DIET: Your first meal following the procedure should be a light meal and then it is ok to progress to your normal diet.  A half-sandwich or bowl of soup is an example of a good first meal.  Heavy or fried foods are harder to digest and may make you feel nauseous or bloated.  Likewise meals heavy in dairy and vegetables can cause extra gas to form and this can also increase the bloating.  Drink plenty of fluids but you should avoid alcoholic beverages for 24 hours.  ACTIVITY: Your care partner should take you home directly after the procedure.  You should plan to take it easy, moving slowly for the rest of the day.  You can resume normal activity the day after the procedure however you should NOT DRIVE or use heavy machinery for 24 hours (because of the sedation medicines used during the test).    SYMPTOMS TO REPORT IMMEDIATELY: A gastroenterologist can be reached at any hour.  During normal business hours, 8:30 AM to 5:00 PM Monday through Friday,  call [phone].  After hours and on weekends, please call the GI answering service at [phone] who will take a message and have the physician on call contact you.   Following lower endoscopy (colonoscopy or flexible sigmoidoscopy):  Excessive amounts of blood in the stool  Significant tenderness or worsening of abdominal pains  Swelling of the abdomen that is new, acute  Fever of 100F or higher  Following upper endoscopy (EGD)  Vomiting of blood or coffee ground material  New chest pain or pain under the shoulder blades  Painful or persistently difficult swallowing  New shortness of breath  Fever of 100F or higher  Black, tarry-looking stools  FOLLOW UP: If any biopsies were taken you will be contacted by phone or by letter within the next 1-3 weeks.  Call your gastroenterologist if you have not heard about the biopsies in 3 weeks.  Our staff will call the home number listed on your records the next business day following your procedure to check on you and address any questions or concerns that you may have at that time regarding the information given to you following your procedure. This is a courtesy call and so if there is no answer at the home number and we have not heard from you through the emergency physician on call, we will assume that you have returned to your regular daily activities without incident.  SIGNATURES/CONFIDENTIALITY: You and/or your care   partner have signed paperwork which will be entered into your electronic medical record.  These signatures attest to the fact that that the information above on your After Visit Summary has been reviewed and is understood.  Full responsibility of the confidentiality of this discharge information lies with you and/or your care-partner.  Polyp and diverticulosis information given. Iron replacement and follow up with your primary care doctor.

## 2014-08-07 NOTE — Op Note (Signed)
[place]  [address]. [place], [address]   COLONOSCOPY PROCEDURE REPORT  PATIENT: Megan Hobbs, Megan Hobbs  MR#: [id number] BIRTHDATE: Feb 10, 1941 , 73  yrs. old GENDER: female ENDOSCOPIST: Ladene Artist, MD, [facility] PROCEDURE DATE:  08/07/2014 PROCEDURE:   Colonoscopy with biopsy First Screening Colonoscopy - Avg.  risk and is 50 yrs.  old or older - No.  Prior Negative Screening - Now for repeat screening. N/A  History of Adenoma - Now for follow-up colonoscopy & has been > or = to 3 yrs.  N/A  Polyps Removed Today? Yes. ASA CLASS:   Class II INDICATIONS:iron deficiency anemia. MEDICATIONS: Monitored anesthesia care and Propofol 200 mg IV DESCRIPTION OF PROCEDURE:   After the risks benefits and alternatives of the procedure were thoroughly explained, informed consent was obtained.  The digital rectal exam revealed no abnormalities of the rectum.   The LB [id number]  endoscope was introduced through the anus and advanced to the cecum, which was identified by both the appendix and ileocecal valve. No adverse events experienced.   The quality of the prep was excellent, using MoviPrep  The instrument was then slowly withdrawn as the colon was fully examined.  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   A sessile polyp measuring 5 mm in size was found in the transverse colon.  A polypectomy was performed with cold forceps.  The resection was complete, the polyp tissue was completely retrieved and sent to histology.   There was diverticulosis noted in the descending colon.   The examination was otherwise normal.  Retroflexed views revealed no abnormalities. The time to cecum=2 minutes 53 seconds.  Withdrawal time=7 minutes 34 seconds.  The scope was withdrawn and the procedure completed. COMPLICATIONS: There were no immediate complications.  ENDOSCOPIC IMPRESSION: 1.   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    Sessile polyp in the transverse colon; polypectomy performed with cold forceps 3.   Mild diverticulosis in the descending colon 4.   The examination was otherwise normal  RECOMMENDATIONS: 1.  Await pathology results 2.  Repeat colonoscopy in 5 years if polyp adenomatous; otherwise no plans for screening colonoscopy as these types of exams usually stop around age 45 3.  AVM could be source for fe def anemia 4.  Fe replacement and follow up with PCP  eSigned:  Ladene Artist, MD, [facility] 08/07/2014 3:48 PM

## 2014-08-07 NOTE — Op Note (Signed)
[place]  [address]. [place], [address]   ENDOSCOPY PROCEDURE REPORT  PATIENT: Megan Hobbs, Megan Hobbs  MR#: [id number] BIRTHDATE: Jun 24, 1941 , 73  yrs. old GENDER: female ENDOSCOPIST: Ladene Artist, MD, [facility] REFERRED BY: PROCEDURE DATE:  08/07/2014 PROCEDURE:  EGD w/ biopsy ASA CLASS:     Class II INDICATIONS:  history of esophageal reflux and iron deficiency anemia. MEDICATIONS: Residual sedation present, Monitored anesthesia care, Propofol 100 mg IV, and Glycopyrrolate (Robinul) 0.2 mg IV TOPICAL ANESTHETIC: none DESCRIPTION OF PROCEDURE: After the risks benefits and alternatives of the procedure were thoroughly explained, informed consent was obtained.  The LB [id number] endoscope was introduced through the mouth and advanced to the second portion of the duodenum , Without limitations.  The instrument was slowly withdrawn as the mucosa was fully examined.    EXAM: The esophagus and gastroesophageal junction were completely normal in appearance.  The stomach was entered and closely examined. The antrum, angularis, and lesser curvature were well visualized, including a retroflexed view of the cardia and fundus. The stomach wall was normally distensable. The stomach appeared normal. The scope passed easily through the pylorus into the duodenum. The duodenum appeared normal. Random biopsies taken in the bulb and 2nd duodenum. Retroflexed views revealed no abnormalities.     The scope was then withdrawn from the patient and the procedure completed.  COMPLICATIONS: There were no immediate complications.  ENDOSCOPIC IMPRESSION: 1.  Normal appearing EGD  RECOMMENDATIONS: 1.  Await pathology results 2.  Follow-up appointment with primary MD as planned   eSigned:  Ladene Artist, MD, [facility] 08/07/2014 3:52 PM

## 2014-08-07 NOTE — Progress Notes (Signed)
Patient awakening,vss,report to rn 

## 2014-08-08 ENCOUNTER — Telehealth: Payer: Self-pay | Admitting: *Deleted

## 2014-08-08 NOTE — Telephone Encounter (Signed)
  Follow up Call-  Call back number 08/07/2014  Post procedure Call Back phone  # [phone]  Permission to leave phone message Yes     Patient questions:  Do you have a fever, pain , or abdominal swelling? No. Pain Score  0 *  Have you tolerated food without any problems? Yes.    Have you been able to return to your normal activities? Yes.    Do you have any questions about your discharge instructions: Diet   No. Medications  No. Follow up visit  No.  Do you have questions or concerns about your Care? No.  Actions: * If pain score is 4 or above: No action needed, pain <4.

## 2014-08-09 ENCOUNTER — Ambulatory Visit
Admission: RE | Admit: 2014-08-09 | Discharge: 2014-08-09 | Disposition: A | Discharge status: Home / Self Care | Payer: Commercial Managed Care - HMO | Source: Ambulatory Visit | Attending: Family Medicine | Admitting: Family Medicine

## 2014-08-09 DIAGNOSIS — M899 Disorder of bone, unspecified: Secondary | ICD-10-CM

## 2014-08-09 DIAGNOSIS — M949 Disorder of cartilage, unspecified: Principal | ICD-10-CM

## 2014-08-09 DIAGNOSIS — Z1231 Encounter for screening mammogram for malignant neoplasm of breast: Secondary | ICD-10-CM

## 2014-08-14 ENCOUNTER — Other Ambulatory Visit: Payer: Self-pay | Admitting: *Deleted

## 2014-08-14 MED ORDER — ALPRAZOLAM 0.25 MG PO TABS: ORAL_TABLET | ORAL | Status: DC

## 2014-08-14 MED ORDER — ZOLPIDEM TARTRATE 5 MG PO TABS: 5.0000 mg | ORAL_TABLET | Freq: Every evening | ORAL | Status: DC | PRN

## 2014-08-14 MED ORDER — COLCHICINE 0.6 MG PO TABS: 0.6000 mg | ORAL_TABLET | Freq: Two times a day (BID) | ORAL | Status: DC

## 2014-08-14 NOTE — Telephone Encounter (Signed)
Pt requesting medication refill. Last f/u appt 06/2014-CPE. Ok to fill per Dr Deborra Medina. Rx to be faxed to requested pharmacy by end of day, today.

## 2014-08-15 ENCOUNTER — Encounter: Payer: Self-pay | Admitting: Gastroenterology

## 2014-08-21 ENCOUNTER — Telehealth: Payer: Self-pay

## 2014-08-21 NOTE — Telephone Encounter (Signed)
Pt left v/m ins not covering colchicine and esomeprazole; pt request to contact ins co. Spoke with Brandy at [facility]; ins is paying on colchicine but has high copay; pts cost is $154.00 and esomeprazole is not covered. Theadora Rama suggested to call ins co. [phone]  And ID# [id number]. Pt request cb.

## 2014-08-22 NOTE — Telephone Encounter (Signed)
Lm on pts vm requesting a call back 

## 2014-08-24 NOTE — Telephone Encounter (Signed)
Lm on pts vm requesting a call back 

## 2014-08-27 ENCOUNTER — Ambulatory Visit: Payer: Commercial Managed Care - HMO | Admitting: Family Medicine

## 2014-08-28 ENCOUNTER — Ambulatory Visit: Payer: Commercial Managed Care - HMO | Admitting: Family Medicine

## 2014-09-07 ENCOUNTER — Other Ambulatory Visit: Payer: Self-pay | Admitting: Family Medicine

## 2014-09-07 NOTE — Telephone Encounter (Signed)
I am happy to do so for a month or two but no longer.   I am so glad she will get to be with her family.  I wish her the best.

## 2014-09-07 NOTE — Telephone Encounter (Signed)
Patient called to let you know she has moved to [place] to live with her daughter.  Patient wants to know if you'll still refill her medication until she finds a new doctor in [place].

## 2014-09-07 NOTE — Telephone Encounter (Signed)
Lm on pts vm requesting a call back 

## 2014-09-07 NOTE — Telephone Encounter (Signed)
Will leave this for Dr. Aron 

## 2014-09-14 MED ORDER — ZOLPIDEM TARTRATE 5 MG PO TABS: 5.0000 mg | ORAL_TABLET | Freq: Every evening | ORAL | Status: AC | PRN

## 2014-09-14 MED ORDER — COLCHICINE 0.6 MG PO TABS: 0.6000 mg | ORAL_TABLET | Freq: Two times a day (BID) | ORAL | Status: AC

## 2014-09-14 MED ORDER — ALPRAZOLAM 0.25 MG PO TABS: ORAL_TABLET | ORAL | Status: AC

## 2014-09-14 NOTE — Telephone Encounter (Signed)
Pt requesting medication refill since she has moved to [place].

## 2014-09-14 NOTE — Addendum Note (Signed)
Addended by: Modena Nunnery on: 09/14/2014 03:52 PM   Modules accepted: Orders

## 2014-09-15 ENCOUNTER — Other Ambulatory Visit: Payer: Self-pay | Admitting: Family Medicine

## 2014-09-15 NOTE — Telephone Encounter (Signed)
Rx called in to pts requested pharmacy in [place]. Pt given enough tabs to last until 12/17 appt. Pt advised she may have to pay for Rx out of pocket

## 2014-12-25 ENCOUNTER — Telehealth: Payer: Self-pay

## 2014-12-25 NOTE — Telephone Encounter (Signed)
Left message advising pt to call back if she still wants flu vaccine

## 2018-04-19 NOTE — H&P (Signed)
[place] [facility]  [address]  [place], [place] [id number]    PATIENT:  Alison Spencer, Alison Spencer  MRN:  [id number]  DOB:  03/13/1941  ACCT#:  [id number]  ADMIT DATE:  04/21/2018                                 PRE-OP HISTORY AND PHYSICAL    HISTORY OF PRESENT ILLNESS:  The patient is a 77-year woman known to our practice,  who presents for cataract extraction with lens implant on the left eye due to  progressive blurring of vision with cloudy vision and depth perception problems.  She  also reports glare in the evenings.    ALLERGIES:  LATEX AND PENICILLIN.    MEDICAL HISTORY:  Acid reflux, allergies, anxiety, type 2 diabetes, and  hypercholesterolemia.  She also has a history of macular degeneration for which she  receives intravitreal injections with Dr. Jomarie LongsJoseph.    SYSTEMIC MEDICATIONS:  Include Ambien, baby aspirin, atorvastatin, Claritin, iron,  Nasomist, omeprazole, Xanax and Zoloft.    SOCIAL HISTORY:  Former tobacco smoker.  No alcohol or drug use reported.    REVIEW OF SYSTEMS:  Negative for any pertinent findings.    PHYSICAL EXAMINATION:  GENERAL:  The patient is awake, alert and oriented x3 at the time of exam.  HEENT:  Spectacle corrected visual acuity is 20/70 in the left eye, pinhole to 20/40.  Intraocular pressure of 17 mmHg.  Confrontation to visual fields full.  Pupils are  round and reactive with no afferent pupillary defect.  Slit-lamp examination is  within normal limits bilaterally.  Dilated funduscopic exam reveals intact and flat  retina.  Lens examination reveals a 3+ nuclear sclerotic cataract with 2+ cortical  changes in the left lens.    ASSESSMENT AND PLAN:  The patient has visually significant cataract in the left eye,  affecting activities of daily living.  Risks, benefits, and alternatives of cataract  surgery were discussed with the patient including but not limited to pain, bleeding,  infection, inflammation, loss of vision, loss of eye, retained lens, vitreous loss,   retinal detachment, glaucoma, corneal clouding, need for more surgery or laser, need  for glasses or contacts after surgery.  Also discussed with the patient the potential  limited visual potential due to history of preexisting macular degeneration for which  she is receiving treatment with a retinal specialist.  It was indicated that she  would need to continue this followup and there could be worsening in the condition of  the retina postoperatively that could lead to permanent vision loss that is  irreversible and may require additional treatment and surgeries.  The patient  understands these risks and wishes to proceed.  She is scheduled for cataract  extraction with lens implant on the left eye under monitored anesthesia care with  local anesthesia pending medical clearance.                       __________________________________________________                                    Alison OhmSAMUEL Alethea Terhaar, MD    DD:  #04/19/2018 11:31:51/SB      DT:  04/19/2018 11:37:[id number]  Job #:  [id number] / [id number]

## 2018-04-21 ENCOUNTER — Inpatient Hospital Stay: Payer: MEDICARE

## 2018-04-21 MED ORDER — SODIUM CHLORIDE 0.9 % IJ SYRG
Freq: Three times a day (TID) | INTRAMUSCULAR | Status: DC
Start: 2018-04-21 — End: 2018-04-21

## 2018-04-21 MED ORDER — PHENYLEPHRINE 1 %-KETOROLAC 0.3 % INTRAOCULAR CONCENTRATE FOR IRRIG
INTRAOCULAR | Status: AC
Start: 2018-04-21 — End: ?

## 2018-04-21 MED ORDER — KETOROLAC TROMETHAMINE 0.5 % EYE DROPS
0.5 % | OPHTHALMIC | Status: DC
Start: 2018-04-21 — End: 2018-04-21
  Administered 2018-04-21 (×3): via OPHTHALMIC

## 2018-04-21 MED ORDER — VANCOMYCIN 1,000 MG IV SOLR
1000 mg | INTRAVENOUS | Status: DC | PRN
Start: 2018-04-21 — End: 2018-04-21
  Administered 2018-04-21: 13:00:00

## 2018-04-21 MED ORDER — FENTANYL CITRATE (PF) 50 MCG/ML IJ SOLN
50 mcg/mL | INTRAMUSCULAR | Status: AC
Start: 2018-04-21 — End: ?

## 2018-04-21 MED ORDER — CYCLOPENTOLATE 1 % EYE DROPS
1 % | OPHTHALMIC | Status: AC
Start: 2018-04-21 — End: ?

## 2018-04-21 MED ORDER — MIDAZOLAM 1 MG/ML IJ SOLN
1 mg/mL | INTRAMUSCULAR | Status: DC | PRN
Start: 2018-04-21 — End: 2018-04-21
  Administered 2018-04-21 (×2): via INTRAVENOUS

## 2018-04-21 MED ORDER — CHONDROITIN-SOD HYALURON 3 %-4 % (0.5 ML) 1 %(0.55 ML) INTRAOCULAR KIT
3 %-4 %(0.5 mL) 1 % (0.55 mL) | INTRAOCULAR | Status: DC | PRN
Start: 2018-04-21 — End: 2018-04-21
  Administered 2018-04-21: 13:00:00 via INTRAOCULAR

## 2018-04-21 MED ORDER — MIDAZOLAM (PF) 1 MG/ML INJECTION SOLUTION
1 mg/mL | INTRAMUSCULAR | Status: AC
Start: 2018-04-21 — End: ?

## 2018-04-21 MED ORDER — LIDOCAINE 2 % MUCOUS MEMBRANE JELLY IN APPLICATOR
2 % | Freq: Once | Status: AC
Start: 2018-04-21 — End: 2018-04-21
  Administered 2018-04-21: 12:00:00 via TOPICAL

## 2018-04-21 MED ORDER — FLURBIPROFEN 0.03 % EYE DROPS
0.03 % | OPHTHALMIC | Status: AC
Start: 2018-04-21 — End: 2018-04-21
  Administered 2018-04-21 (×3): via OPHTHALMIC

## 2018-04-21 MED ORDER — LACTATED RINGERS IV
INTRAVENOUS | Status: DC | PRN
Start: 2018-04-21 — End: 2018-04-21
  Administered 2018-04-21: 13:00:00 via INTRAVENOUS

## 2018-04-21 MED ORDER — TOBRAMYCIN-DEXAMETHASONE 0.3 %-0.1 % EYE DROPS, SUSP
OPHTHALMIC | Status: AC
Start: 2018-04-21 — End: ?

## 2018-04-21 MED ORDER — TOBRAMYCIN 0.3 % EYE DROPS
0.3 % | OPHTHALMIC | Status: DC | PRN
Start: 2018-04-21 — End: 2018-04-21
  Administered 2018-04-21: 13:00:00 via OPHTHALMIC

## 2018-04-21 MED ORDER — SODIUM CHLORIDE 0.9 % IJ SYRG
INTRAMUSCULAR | Status: DC | PRN
Start: 2018-04-21 — End: 2018-04-21

## 2018-04-21 MED ORDER — LIDOCAINE (PF) 20 MG/ML (2 %) IJ SOLN
20 mg/mL (2 %) | INTRAMUSCULAR | Status: AC
Start: 2018-04-21 — End: ?

## 2018-04-21 MED ORDER — CYCLOPENTOLATE 1 % EYE DROPS
1 % | OPHTHALMIC | Status: AC
Start: 2018-04-21 — End: 2018-04-21
  Administered 2018-04-21 (×4): via OPHTHALMIC

## 2018-04-21 MED ORDER — FENTANYL CITRATE (PF) 50 MCG/ML IJ SOLN
50 mcg/mL | INTRAMUSCULAR | Status: DC | PRN
Start: 2018-04-21 — End: 2018-04-21
  Administered 2018-04-21 (×2): via INTRAVENOUS

## 2018-04-21 MED ORDER — TROPICAMIDE 1 % EYE DROPS
1 % | OPHTHALMIC | Status: AC
Start: 2018-04-21 — End: 2018-04-21
  Administered 2018-04-21 (×3): via OPHTHALMIC

## 2018-04-21 MED ORDER — LIDOCAINE (PF) 20 MG/ML (2 %) IJ SOLN
20 mg/mL (2 %) | INTRAMUSCULAR | Status: DC | PRN
Start: 2018-04-21 — End: 2018-04-21
  Administered 2018-04-21: 13:00:00 via TOPICAL

## 2018-04-21 MED ORDER — FLURBIPROFEN 0.03 % EYE DROPS
0.03 % | OPHTHALMIC | Status: AC
Start: 2018-04-21 — End: ?

## 2018-04-21 MED ORDER — PHENYLEPHRINE 2.5 % EYE DROPS
2.5 % | OPHTHALMIC | Status: AC
Start: 2018-04-21 — End: 2018-04-21
  Administered 2018-04-21 (×3): via OPHTHALMIC

## 2018-04-21 MED ORDER — LIDOCAINE 2 % MUCOUS MEMBRANE JELLY IN APPLICATOR
2 % | Status: DC | PRN
Start: 2018-04-21 — End: 2018-04-21
  Administered 2018-04-21: 13:00:00 via TOPICAL

## 2018-04-21 MED FILL — FENTANYL CITRATE (PF) 50 MCG/ML IJ SOLN: 50 mcg/mL | INTRAMUSCULAR | Qty: 2

## 2018-04-21 MED FILL — TOBRAMYCIN-DEXAMETHASONE 0.3 %-0.1 % EYE DROPS, SUSP: OPHTHALMIC | Qty: 2.5

## 2018-04-21 MED FILL — LIDOCAINE (PF) 20 MG/ML (2 %) IJ SOLN: 20 mg/mL (2 %) | INTRAMUSCULAR | Qty: 2

## 2018-04-21 MED FILL — FLURBIPROFEN 0.03 % EYE DROPS: 0.03 % | OPHTHALMIC | Qty: 1

## 2018-04-21 MED FILL — BD POSIFLUSH NORMAL SALINE 0.9 % INJECTION SYRINGE: INTRAMUSCULAR | Qty: 40

## 2018-04-21 MED FILL — OMIDRIA 1 %-0.3 % INTRAOCULAR CONCENTRATE: INTRAOCULAR | Qty: 4

## 2018-04-21 MED FILL — CYCLOPENTOLATE 1 % EYE DROPS: 1 % | OPHTHALMIC | Qty: 2

## 2018-04-21 MED FILL — GLYDO 2 % MUCOSAL JELLY IN APPLICATOR: 2 % | Qty: 6

## 2018-04-21 MED FILL — PHENYLEPHRINE 2.5 % EYE DROPS: 2.5 % | OPHTHALMIC | Qty: 2

## 2018-04-21 MED FILL — TROPICAMIDE 1 % EYE DROPS: 1 % | OPHTHALMIC | Qty: 3

## 2018-04-21 MED FILL — MIDAZOLAM (PF) 1 MG/ML INJECTION SOLUTION: 1 mg/mL | INTRAMUSCULAR | Qty: 2

## 2018-04-21 NOTE — Op Note (Signed)
DATE:04/21/2018  SURGEON: Kherington Meraz A. Demonta Wombles, MD   PREOPERATIVE DIAGNOSIS: Cataract, left eye.   POSTOPERATIVE DIAGNOSIS: Cataract, left eye.   PROCEDURE PERFORMED: Phacoemulsification cataract   extraction with posterior chamber intraocular lens in the left eye.   ANESTHESIOLOGIST: Dr. Medina  ASSISTANT: None.   ANESTHESIA: Topical.   DRAINS: None.   SPECIMEN: None.   IMPLANT: SN60WF  ESTIMATED BLOOD LOSS: None.   COMPLICATIONS: None.   DISPOSITION: Stable to recovery.   DESCRIPTION OF PROCEDURE:   After discussing the risks, benefits, and alternatives with the patient, appropriate informed operative consent was obtained. The patient was brought to the   operating room, placed in the supine position on the operating bed and appropriate monitoring was established by anesthesia.   Ocular anesthesia was achieved with 2% lidocaine jelly in the operative eye.   The patient was then prepped and draped in the usual sterile fashion for major   intraocular surgery. Lid speculum was placed in the operative eye.   Paracentesis incisions were made at approximately the 5'o'clock and 11'o'clock positions and was followed by injection of preservative-free lidocaine into the anterior   chamber. Next, the anterior chamber was filled with viscoelastic. A   temporal clear corneal incision was made with a 2.75-mm   keratome blade. A cystotome needle was used to initiate a   capsulorrhexis tear. Utrata forceps were used to complete a   continuous curvilinear capsulorrhexis. BSS on a cannula was used   to perform hydrodissection and hydrodelineation of the lens. Lens   was confirmed to be freely mobile. Phacoemulsification unit, after   being properly tuned and tested, was used to emulsify and remove   the nucleus without complication. Residual cortical material was   removed using bimanual irrigation-aspiration. Next, the capsular   bag was inflated with viscoelastic. The appropriately powered   intraocular lens was folded and injected  through the clear corneal   wound into the capsular bag. Lens was positioned using a Kuglen   hook. Correct orientation and centration was confirmed by direct   visualization. Next, residual viscoelastic was removed using   irrigation and aspiration. BSS on a canula was used to hydrate the wounds as needed. Wounds were tested for leaks and none were found. Anterior chamber was deep and formed. Lens was well positioned within the capsular bag. Speculum was removed   along with the drapes. TobraDex eyedrops were placed in the   operative eye and shield was placed over the operative eye. The   patient tolerated the procedure well and was brought to recovery room   in stable condition. There were no complications.

## 2018-04-21 NOTE — Anesthesia Pre-Procedure Evaluation (Signed)
Relevant Problems   No relevant active problems       Anesthetic History   No history of anesthetic complications            Review of Systems / Medical History  Patient summary reviewed and pertinent labs reviewed    Pulmonary                   Neuro/Psych         Psychiatric history     Cardiovascular  Within defined limits                     GI/Hepatic/Renal     GERD           Endo/Other  Within defined limits           Other Findings              Physical Exam    Airway  Mallampati: II  TM Distance: 4 - 6 cm  Neck ROM: normal range of motion   Mouth opening: Normal     Cardiovascular  Regular rate and rhythm,  S1 and S2 normal,  no murmur, click, rub, or gallop             Dental  No notable dental hx       Pulmonary  Breath sounds clear to auscultation               Abdominal  GI exam deferred       Other Findings            Anesthetic Plan    ASA: 2  Anesthesia type: MAC          Induction: Intravenous  Anesthetic plan and risks discussed with: Patient

## 2018-04-21 NOTE — H&P (Signed)
[facility]  [address]  [place], [place] [id number]    PATIENT:  Alison MoronFREESE, Lillia  MRN:  [id number]  DOB:  [birth year]  ACCT#:  [id number]  ADMIT DATE:  04/21/2018                                 PRE-OP HISTORY AND PHYSICAL    HISTORY OF PRESENT ILLNESS:  The patient is a 77-year woman known to our practice,  who presents for cataract extraction with lens implant on the left eye due to  progressive blurring of vision with cloudy vision and depth perception problems.  She  also reports glare in the evenings.    ALLERGIES:  LATEX AND PENICILLIN.    MEDICAL HISTORY:  Acid reflux, allergies, anxiety, type 2 diabetes, and  hypercholesterolemia.  She also has a history of macular degeneration for which she  receives intravitreal injections with Dr. Jomarie LongsJoseph.    SYSTEMIC MEDICATIONS:  Include Ambien, baby aspirin, atorvastatin, Claritin, iron,  Nasomist, omeprazole, Xanax and Zoloft.    SOCIAL HISTORY:  Former tobacco smoker.  No alcohol or drug use reported.    REVIEW OF SYSTEMS:  Negative for any pertinent findings.    PHYSICAL EXAMINATION:  GENERAL:  The patient is awake, alert and oriented x3 at the time of exam.  HEENT:  Spectacle corrected visual acuity is 20/70 in the left eye, pinhole to 20/40.  Intraocular pressure of 17 mmHg.  Confrontation to visual fields full.  Pupils are  round and reactive with no afferent pupillary defect.  Slit-lamp examination is  within normal limits bilaterally.  Dilated funduscopic exam reveals intact and flat  retina.  Lens examination reveals a 3+ nuclear sclerotic cataract with 2+ cortical  changes in the left lens.    ASSESSMENT AND PLAN:  The patient has visually significant cataract in the left eye,  affecting activities of daily living.  Risks, benefits, and alternatives of cataract  surgery were discussed with the patient including but not limited to pain, bleeding,  infection, inflammation, loss of vision, loss of eye, retained lens, vitreous loss,  retinal  detachment, glaucoma, corneal clouding, need for more surgery or laser, need  for glasses or contacts after surgery.  Also discussed with the patient the potential  limited visual potential due to history of preexisting macular degeneration for which  she is receiving treatment with a retinal specialist.  It was indicated that she  would need to continue this followup and there could be worsening in the condition of  the retina postoperatively that could lead to permanent vision loss that is  irreversible and may require additional treatment and surgeries.  The patient  understands these risks and wishes to proceed.  She is scheduled for cataract  extraction with lens implant on the left eye under monitored anesthesia care with  local anesthesia pending medical clearance.                       __________________________________________________                                    Thayer OhmSAMUEL Michaelanthony Kempton, MD    DD:  #04/19/2018 11:31:51/SB      DT:  04/19/2018 11:37:[id number]  Job #:  [id number] / [id number]

## 2018-04-21 NOTE — Interval H&P Note (Signed)
 Discharge instructions given to patient and family, encouraged to ask questions.verbalized understanding Written instructions given to patient   Discharged home to care of family

## 2018-04-21 NOTE — Progress Notes (Signed)
Need patients HT and WT entered in computer.

## 2018-04-21 NOTE — Anesthesia Post-Procedure Evaluation (Signed)
Procedure(s):  CATARACT EXTRACTION WITH INTRA OCULAR LENS IMPLANT LEFT.    MAC    Anesthesia Post Evaluation      Multimodal analgesia: multimodal analgesia used between 6 hours prior to anesthesia start to PACU discharge  Patient location during evaluation: bedside  Patient participation: complete - patient participated  Level of consciousness: awake and alert  Pain score: 0  Pain management: adequate  Airway patency: patent  Anesthetic complications: no  Cardiovascular status: acceptable  Respiratory status: acceptable and room air  Hydration status: acceptable  Post anesthesia nausea and vomiting:  none      Vitals Value Taken Time   BP 115/54 04/21/2018  9:22 AM   Temp 36.7 ??C (98 ??F) 04/21/2018  9:22 AM   Pulse 68 04/21/2018  9:22 AM   Resp 18 04/21/2018  9:22 AM   SpO2 100 % 04/21/2018  9:22 AM

## 2018-04-21 NOTE — Interval H&P Note (Signed)
H&P Update:  Alison Spencer was seen and examined.  History and physical has been reviewed. The patient has been examined. There have been no significant clinical changes since the completion of the originally dated History and Physical.

## 2018-04-21 NOTE — Anesthesia Post-Procedure Evaluation (Signed)
Procedure(s):  CATARACT EXTRACTION WITH INTRA OCULAR LENS IMPLANT LEFT.    MAC    Anesthesia Post Evaluation      Multimodal analgesia: multimodal analgesia used between 6 hours prior to anesthesia start to PACU discharge  Patient location during evaluation: bedside  Patient participation: complete - patient participated  Level of consciousness: awake and alert  Pain score: 0  Pain management: adequate  Airway patency: patent  Anesthetic complications: no  Cardiovascular status: acceptable  Respiratory status: acceptable and room air  Hydration status: acceptable  Post anesthesia nausea and vomiting:  none      Vitals Value Taken Time   BP 115/54 04/21/2018  9:22 AM   Temp 36.7 ??C (98 ??F) 04/21/2018  9:22 AM   Pulse 68 04/21/2018  9:22 AM   Resp 18 04/21/2018  9:22 AM   SpO2 100 % 04/21/2018  9:22 AM

## 2018-04-21 NOTE — Op Note (Addendum)
DATE:04/21/2018  SURGEON: Truett PernaSamuel A. Jaeley Wiker, MD   PREOPERATIVE DIAGNOSIS: Cataract, left eye.   POSTOPERATIVE DIAGNOSIS: Cataract, left eye.   PROCEDURE PERFORMED: Phacoemulsification cataract   extraction with posterior chamber intraocular lens in the left eye.   ANESTHESIOLOGIST: Dr. Lucius ConnMedina  ASSISTANT: None.   ANESTHESIA: Topical.   DRAINS: None.   SPECIMEN: None.   IMPLANT: SN60WF  ESTIMATED BLOOD LOSS: None.   COMPLICATIONS: None.   DISPOSITION: Stable to recovery.   DESCRIPTION OF PROCEDURE:   After discussing the risks, benefits, and alternatives with the patient, appropriate informed operative consent was obtained. The patient was brought to the   operating room, placed in the supine position on the operating bed and appropriate monitoring was established by anesthesia.   Ocular anesthesia was achieved with 2% lidocaine jelly in the operative eye.   The patient was then prepped and draped in the usual sterile fashion for major   intraocular surgery. Lid speculum was placed in the operative eye.   Paracentesis incisions were made at approximately the 5'o'clock and 11'o'clock positions and was followed by injection of preservative-free lidocaine into the anterior   chamber. Next, the anterior chamber was filled with viscoelastic. A   temporal clear corneal incision was made with a 2.75-mm   keratome blade. A cystotome needle was used to initiate a   capsulorrhexis tear. Utrata forceps were used to complete a   continuous curvilinear capsulorrhexis. BSS on a cannula was used   to perform hydrodissection and hydrodelineation of the lens. Lens   was confirmed to be freely mobile. Phacoemulsification unit, after   being properly tuned and tested, was used to emulsify and remove   the nucleus without complication. Residual cortical material was   removed using bimanual irrigation-aspiration. Next, the capsular   bag was inflated with viscoelastic. The appropriately powered    intraocular lens was folded and injected through the clear corneal   wound into the capsular bag. Lens was positioned using a Kuglen   hook. Correct orientation and centration was confirmed by direct   visualization. Next, residual viscoelastic was removed using   irrigation and aspiration. BSS on a canula was used to hydrate the wounds as needed. Wounds were tested for leaks and none were found. Anterior chamber was deep and formed. Lens was well positioned within the capsular bag. Speculum was removed   along with the drapes. TobraDex eyedrops were placed in the   operative eye and shield was placed over the operative eye. The   patient tolerated the procedure well and was brought to recovery room   in stable condition. There were no complications.

## 2018-04-21 NOTE — Anesthesia Pre-Procedure Evaluation (Signed)
Relevant Problems   No relevant active problems       Anesthetic History   No history of anesthetic complications            Review of Systems / Medical History  Patient summary reviewed and pertinent labs reviewed    Pulmonary                   Neuro/Psych         Psychiatric history     Cardiovascular  Within defined limits                     GI/Hepatic/Renal     GERD           Endo/Other  Within defined limits           Other Findings              Physical Exam    Airway  Mallampati: II  TM Distance: 4 - 6 cm  Neck ROM: normal range of motion   Mouth opening: Normal     Cardiovascular  Regular rate and rhythm,  S1 and S2 normal,  no murmur, click, rub, or gallop             Dental  No notable dental hx       Pulmonary  Breath sounds clear to auscultation               Abdominal  GI exam deferred       Other Findings            Anesthetic Plan    ASA: 2  Anesthesia type: MAC          Induction: Intravenous  Anesthetic plan and risks discussed with: Patient

## 2018-04-21 NOTE — Progress Notes (Signed)
Need patients HT and WT entered in computer.

## 2018-04-21 NOTE — Other (Signed)
Discharge instructions given to patient and family, encouraged to ask questions.verbalized understanding Written instructions given to patient   Discharged home to care of family

## 2018-04-22 MED FILL — LACTATED RINGERS IV: INTRAVENOUS | Qty: 1000

## 2018-06-21 NOTE — H&P (Signed)
[facility]  [address]  [place], [place] [id number]    PATIENT:  Alison Spencer Spencer, Alison Spencer  MRN:  [id number]  DOB:  10-18-1940  ACCT#:  [id number]  ADMIT DATE:  06/23/2018                                 PRE-OP HISTORY AND PHYSICAL    HISTORY OF PRESENT ILLNESS:  The patient is a 77-year woman known to our practice,  who presents for cataract extraction with lens implant on the right eye.  She has  undergone previous uncomplicated cataract surgery on the left eye.  She presents with  cloudy and hazy vision in the right eye affecting activities of daily living.    ALLERGIES:   LATEX AND PENICILLIN.    PAST MEDICAL HISTORY:  Acid reflux, allergies, anxiety, type 2 diabetes mellitus,  hypercholesterolemia.    SYSTEMIC MEDICATIONS:  Include Ambien, baby aspirin, atorvastatin, Claritin, iron,  Nasonex, omeprazole, Xanax, and Zoloft.    SOCIAL HISTORY:  Former tobacco smoker.  No alcohol or drug use reported.    PHYSICAL EXAMINATION:  Uncorrected visual acuity is 20/400 in the right eye.   Confrontation of visual fields full.  Pupils are round and reactive with no afferent  pupillary defect.  Slit-lamp examination is unremarkable.  Lens examination reveals a  well-centered posterior chamber intraocular lens in the left eye and a 3+ nuclear  sclerotic cataract in the right eye with 2+ cortical changes.  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    ASSESSMENT AND PLAN:  The patient has visually significant cataract in the right eye  affecting activities of daily living.  Risks, benefits, and alternatives of cataract  surgery were discussed with the patient including, but not limited to pain, bleeding,  infection, inflammation, loss of vision, loss of eye, retained lens, vitreous loss,  retinal detachment, glaucoma, corneal clouding, need for more surgery or laser, need   for glasses or contacts after surgery.  The patient understands these risks and  wishes proceed.  She is scheduled for cataract extraction with lens implant on the  right eye under monitored anesthesia care with local anesthesia pending medical  clearance.                       __________________________________________________                                    Thayer OhmSAMUEL Samona Chihuahua, MD    DD:  #06/21/2018 14:18:57/SB      DT:  06/21/2018 14:23:54/[email or web address]  Job #:  [id number] / [id number]

## 2018-06-23 ENCOUNTER — Inpatient Hospital Stay: Payer: MEDICARE

## 2018-06-23 MED ORDER — LIDOCAINE (PF) 20 MG/ML (2 %) IJ SOLN
20 mg/mL (2 %) | INTRAMUSCULAR | Status: AC
Start: 2018-06-23 — End: ?

## 2018-06-23 MED ORDER — KETOROLAC TROMETHAMINE 0.5 % EYE DROPS
0.5 % | OPHTHALMIC | Status: AC
Start: 2018-06-23 — End: 2018-06-23
  Administered 2018-06-23 (×3): via OPHTHALMIC

## 2018-06-23 MED ORDER — EPINEPHRINE (PF) 1 MG/ML INJECTION
1 mg/mL ( mL) | INTRAMUSCULAR | Status: AC
Start: 2018-06-23 — End: ?

## 2018-06-23 MED ORDER — SODIUM CHLORIDE 0.9 % IJ SYRG
INTRAMUSCULAR | Status: DC | PRN
Start: 2018-06-23 — End: 2018-06-23

## 2018-06-23 MED ORDER — FENTANYL CITRATE (PF) 50 MCG/ML IJ SOLN
50 mcg/mL | INTRAMUSCULAR | Status: DC | PRN
Start: 2018-06-23 — End: 2018-06-23
  Administered 2018-06-23 (×2): via INTRAVENOUS

## 2018-06-23 MED ORDER — LIDOCAINE HCL 2 % (20 MG/ML) IJ SOLN
20 mg/mL (2 %) | INTRAMUSCULAR | Status: DC | PRN
Start: 2018-06-23 — End: 2018-06-23
  Administered 2018-06-23: 18:00:00

## 2018-06-23 MED ORDER — TROPICAMIDE 1 % EYE DROPS
1 % | OPHTHALMIC | Status: AC
Start: 2018-06-23 — End: 2018-06-23
  Administered 2018-06-23 (×3): via OPHTHALMIC

## 2018-06-23 MED ORDER — SODIUM CHLORIDE 0.9 % IJ SYRG
Freq: Three times a day (TID) | INTRAMUSCULAR | Status: DC
Start: 2018-06-23 — End: 2018-06-23

## 2018-06-23 MED ORDER — PHENYLEPHRINE 1 %-KETOROLAC 0.3 % INTRAOCULAR CONCENTRATE FOR IRRIG
INTRAOCULAR | Status: DC | PRN
Start: 2018-06-23 — End: 2018-06-23
  Administered 2018-06-23: 18:00:00 via OPHTHALMIC

## 2018-06-23 MED ORDER — FENTANYL CITRATE (PF) 50 MCG/ML IJ SOLN
50 mcg/mL | INTRAMUSCULAR | Status: AC
Start: 2018-06-23 — End: ?

## 2018-06-23 MED ORDER — LIDOCAINE (PF) 40 MG/ML (4 %) IJ SOLN
40 mg/mL (4 %) | INTRAMUSCULAR | Status: AC
Start: 2018-06-23 — End: ?

## 2018-06-23 MED ORDER — CYCLOPENTOLATE 1 % EYE DROPS
1 % | OPHTHALMIC | Status: AC
Start: 2018-06-23 — End: 2018-06-23
  Administered 2018-06-23 (×3): via OPHTHALMIC

## 2018-06-23 MED ORDER — TOBRAMYCIN-DEXAMETHASONE 0.3 %-0.1 % EYE DROPS, SUSP
OPHTHALMIC | Status: AC
Start: 2018-06-23 — End: ?

## 2018-06-23 MED ORDER — BALANCED SALT IRRIG SOLN COMB2 INTRAOCULAR
INTRAOCULAR | Status: AC
Start: 2018-06-23 — End: ?

## 2018-06-23 MED ORDER — MIDAZOLAM (PF) 1 MG/ML INJECTION SOLUTION
1 mg/mL | INTRAMUSCULAR | Status: AC
Start: 2018-06-23 — End: ?

## 2018-06-23 MED ORDER — VANCOMYCIN 500 MG IV SOLR
500 mg | INTRAVENOUS | Status: AC
Start: 2018-06-23 — End: ?

## 2018-06-23 MED ORDER — CHONDROITIN-SOD HYALURON 3 %-4 % (0.5 ML) 1 %(0.55 ML) INTRAOCULAR KIT
3 %-4 %(0.5 mL) 1 % (0.55 mL) | INTRAOCULAR | Status: DC | PRN
Start: 2018-06-23 — End: 2018-06-23
  Administered 2018-06-23: 18:00:00 via OPHTHALMIC

## 2018-06-23 MED ORDER — LACTATED RINGERS IV
INTRAVENOUS | Status: DC | PRN
Start: 2018-06-23 — End: 2018-06-23
  Administered 2018-06-23: 17:00:00 via INTRAVENOUS

## 2018-06-23 MED ORDER — POVIDONE-IODINE 5 % EYE SOLN
5 % | OPHTHALMIC | Status: DC | PRN
Start: 2018-06-23 — End: 2018-06-23
  Administered 2018-06-23: 17:00:00 via OPHTHALMIC

## 2018-06-23 MED ORDER — PHENYLEPHRINE 2.5 % EYE DROPS
2.5 % | OPHTHALMIC | Status: AC
Start: 2018-06-23 — End: 2018-06-23
  Administered 2018-06-23 (×3): via OPHTHALMIC

## 2018-06-23 MED ORDER — MIDAZOLAM 1 MG/ML IJ SOLN
1 mg/mL | INTRAMUSCULAR | Status: DC | PRN
Start: 2018-06-23 — End: 2018-06-23
  Administered 2018-06-23 (×2): via INTRAVENOUS

## 2018-06-23 MED ORDER — LIDOCAINE 2 % MUCOUS MEMBRANE JELLY IN APPLICATOR
2 % | Status: DC | PRN
Start: 2018-06-23 — End: 2018-06-23
  Administered 2018-06-23: 17:00:00 via TOPICAL

## 2018-06-23 MED ORDER — PHENYLEPHRINE 1 %-KETOROLAC 0.3 % INTRAOCULAR CONCENTRATE FOR IRRIG
INTRAOCULAR | Status: AC
Start: 2018-06-23 — End: ?

## 2018-06-23 MED ORDER — FLURBIPROFEN 0.03 % EYE DROPS
0.03 % | OPHTHALMIC | Status: AC
Start: 2018-06-23 — End: ?

## 2018-06-23 MED ORDER — LIDOCAINE HCL 1 % (10 MG/ML) IJ SOLN
10 mg/mL (1 %) | INTRAMUSCULAR | Status: DC | PRN
Start: 2018-06-23 — End: 2018-06-23
  Administered 2018-06-23: 18:00:00

## 2018-06-23 MED ORDER — LIDOCAINE 2 % MUCOUS MEMBRANE JELLY IN APPLICATOR
2 % | Freq: Once | Status: AC
Start: 2018-06-23 — End: 2018-06-23
  Administered 2018-06-23: 17:00:00 via TOPICAL

## 2018-06-23 MED ORDER — TOBRAMYCIN-DEXAMETHASONE 0.3 %-0.1 % EYE DROPS, SUSP
OPHTHALMIC | Status: DC | PRN
Start: 2018-06-23 — End: 2018-06-23
  Administered 2018-06-23: 18:00:00 via OPHTHALMIC

## 2018-06-23 MED ORDER — CYCLOPENTOLATE 1 % EYE DROPS
1 % | OPHTHALMIC | Status: AC
Start: 2018-06-23 — End: ?

## 2018-06-23 MED ORDER — LIDOCAINE (PF) 40 MG/ML (4 %) IJ SOLN
404 mg/mL (4 %) | INTRAMUSCULAR | Status: DC | PRN
Start: 2018-06-23 — End: 2018-06-23
  Administered 2018-06-23: 18:00:00 via OPHTHALMIC

## 2018-06-23 MED FILL — BD POSIFLUSH NORMAL SALINE 0.9 % INJECTION SYRINGE: INTRAMUSCULAR | Qty: 40

## 2018-06-23 MED FILL — FLURBIPROFEN 0.03 % EYE DROPS: 0.03 % | OPHTHALMIC | Qty: 1

## 2018-06-23 MED FILL — CYCLOPENTOLATE 1 % EYE DROPS: 1 % | OPHTHALMIC | Qty: 2

## 2018-06-23 MED FILL — PHENYLEPHRINE 2.5 % EYE DROPS: 2.5 % | OPHTHALMIC | Qty: 2

## 2018-06-23 MED FILL — GLYDO 2 % MUCOSAL JELLY IN APPLICATOR: 2 % | Qty: 6

## 2018-06-23 MED FILL — BSS INTRAOCULAR SOLUTION: INTRAOCULAR | Qty: 15

## 2018-06-23 MED FILL — LIDOCAINE (PF) 20 MG/ML (2 %) IJ SOLN: 20 mg/mL (2 %) | INTRAMUSCULAR | Qty: 2

## 2018-06-23 MED FILL — LIDOCAINE (PF) 40 MG/ML (4 %) IJ SOLN: 40 mg/mL (4 %) | INTRAMUSCULAR | Qty: 5

## 2018-06-23 MED FILL — TOBRAMYCIN-DEXAMETHASONE 0.3 %-0.1 % EYE DROPS, SUSP: OPHTHALMIC | Qty: 2.5

## 2018-06-23 MED FILL — OMIDRIA 1 %-0.3 % INTRAOCULAR CONCENTRATE: INTRAOCULAR | Qty: 4

## 2018-06-23 MED FILL — EPINEPHRINE (PF) 1 MG/ML INJECTION: 1 mg/mL ( mL) | INTRAMUSCULAR | Qty: 2

## 2018-06-23 MED FILL — TROPICAMIDE 1 % EYE DROPS: 1 % | OPHTHALMIC | Qty: 3

## 2018-06-23 MED FILL — FENTANYL CITRATE (PF) 50 MCG/ML IJ SOLN: 50 mcg/mL | INTRAMUSCULAR | Qty: 2

## 2018-06-23 MED FILL — VANCOMYCIN 500 MG IV SOLR: 500 mg | INTRAVENOUS | Qty: 500

## 2018-06-23 MED FILL — MIDAZOLAM (PF) 1 MG/ML INJECTION SOLUTION: 1 mg/mL | INTRAMUSCULAR | Qty: 2

## 2018-06-23 MED FILL — KETOROLAC TROMETHAMINE 0.5 % EYE DROPS: 0.5 % | OPHTHALMIC | Qty: 10

## 2018-06-23 NOTE — Anesthesia Pre-Procedure Evaluation (Signed)
Relevant Problems   No relevant active problems       Anesthetic History   No history of anesthetic complications            Review of Systems / Medical History  Patient summary reviewed, nursing notes reviewed and pertinent labs reviewed    Pulmonary  Within defined limits                 Neuro/Psych         Psychiatric history     Cardiovascular  Within defined limits                Exercise tolerance: >4 METS     GI/Hepatic/Renal     GERD: well controlled           Endo/Other  Within defined limits           Other Findings              Physical Exam    Airway  Mallampati: II  TM Distance: 4 - 6 cm  Neck ROM: normal range of motion   Mouth opening: Normal     Cardiovascular  Regular rate and rhythm,  S1 and S2 normal,  no murmur, click, rub, or gallop             Dental  No notable dental hx       Pulmonary  Breath sounds clear to auscultation               Abdominal  GI exam deferred       Other Findings            Anesthetic Plan    ASA: 2  Anesthesia type: MAC          Induction: Intravenous  Anesthetic plan and risks discussed with: Patient

## 2018-06-23 NOTE — H&P (Signed)
[facility]  [address]  [place], [place] [id number]    PATIENT:  Alison Spencer, Alison Spencer  MRN:  [id number]  DOB:  [birth year]  ACCT#:  [id number]  ADMIT DATE:  06/23/2018                                 PRE-OP HISTORY AND PHYSICAL    HISTORY OF PRESENT ILLNESS:  The patient is a 77-year woman known to our practice,  who presents for cataract extraction with lens implant on the right eye.  She has  undergone previous uncomplicated cataract surgery on the left eye.  She presents with  cloudy and hazy vision in the right eye affecting activities of daily living.    ALLERGIES:   LATEX AND PENICILLIN.    PAST MEDICAL HISTORY:  Acid reflux, allergies, anxiety, type 2 diabetes mellitus,  hypercholesterolemia.    SYSTEMIC MEDICATIONS:  Include Ambien, baby aspirin, atorvastatin, Claritin, iron,  Nasonex, omeprazole, Xanax, and Zoloft.    SOCIAL HISTORY:  Former tobacco smoker.  No alcohol or drug use reported.    PHYSICAL EXAMINATION:  Uncorrected visual acuity is 20/400 in the right eye.   Confrontation of visual fields full.  Pupils are round and reactive with no afferent  pupillary defect.  Slit-lamp examination is unremarkable.  Lens examination reveals a  well-centered posterior chamber intraocular lens in the left eye and a 3+ nuclear  sclerotic cataract in the right eye with 2+ cortical changes.  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    ASSESSMENT AND PLAN:  The patient has visually significant cataract in the right eye  affecting activities of daily living.  Risks, benefits, and alternatives of cataract  surgery were discussed with the patient including, but not limited to pain, bleeding,  infection, inflammation, loss of vision, loss of eye, retained lens, vitreous loss,  retinal detachment, glaucoma, corneal clouding, need for more surgery or laser, need  for glasses or contacts after surgery.  The  patient understands these risks and  wishes proceed.  She is scheduled for cataract extraction with lens implant on the  right eye under monitored anesthesia care with local anesthesia pending medical  clearance.                       __________________________________________________                                    Thayer OhmSAMUEL Annalyn Blecher, MD    DD:  #06/21/2018 14:18:57/SB      DT:  06/21/2018 14:23:54/[email or web address]  Job #:  [id number] / [id number]

## 2018-06-23 NOTE — Op Note (Signed)
DATE:06/23/2018  SURGEON: Truett PernaSamuel A. Vaudine Dutan, MD   PREOPERATIVE DIAGNOSIS: Cataract, right eye.   POSTOPERATIVE DIAGNOSIS: Cataract, right eye.   PROCEDURE PERFORMED: Phacoemulsification cataract   extraction with posterior chamber intraocular lens in the right eye.   ANESTHESIOLOGIST: Dr. Cherre HugerMack  ASSISTANT: None.   ANESTHESIA: Topical.   DRAINS: None.   SPECIMEN: None.   IMPLANT: SN60WF  ESTIMATED BLOOD LOSS: None.   COMPLICATIONS: None.   DISPOSITION: Stable to recovery.   DESCRIPTION OF PROCEDURE:   After discussing the risks, benefits, and alternatives with the patient, appropriate informed operative consent was obtained. The patient was brought to the   operating room, placed in the supine position on the operating bed and appropriate monitoring was established by anesthesia.   Ocular anesthesia was achieved with 2% lidocaine jelly in the operative eye.   The patient was then prepped and draped in the usual sterile fashion for major   intraocular surgery. Lid speculum was placed in the operative eye.   Paracentesis incisions were made at approximately the 5'o'clock and 11'o'clock positions and was followed by injection of preservative-free Shugarcaine into the anterior   chamber. Next, the anterior chamber was filled with viscoelastic. A   temporal clear corneal incision was made with a 2.75-mm   keratome blade. A cystotome needle was used to initiate a   capsulorrhexis tear. Utrata forceps were used to complete a   continuous curvilinear capsulorrhexis. BSS on a cannula was used   to perform hydrodissection and hydrodelineation of the lens. Lens   was confirmed to be freely mobile. Phacoemulsification unit, after   being properly tuned and tested, was used to emulsify and remove   the nucleus without complication. Residual cortical material was   removed using bimanual irrigation-aspiration. Next, the capsular   bag was inflated with viscoelastic. The appropriately powered   intraocular lens was folded and  injected through the clear corneal   wound into the capsular bag. Lens was positioned using a Kuglen   hook. Correct orientation and centration was confirmed by direct   visualization. Next, residual viscoelastic was removed using   irrigation and aspiration. BSS on a canula was used to hydrate the wounds as needed. Wounds were tested for leaks and none were found. Anterior chamber was deep and formed. Lens was well positioned within the capsular bag. Speculum was removed   along with the drapes. TobraDex eyedrops were placed in the   operative eye and shield was placed over the operative eye. The   patient tolerated the procedure well and was brought to recovery room   in stable condition. There were no complications.

## 2018-06-23 NOTE — Anesthesia Pre-Procedure Evaluation (Signed)
Relevant Problems   No relevant active problems       Anesthetic History   No history of anesthetic complications            Review of Systems / Medical History  Patient summary reviewed, nursing notes reviewed and pertinent labs reviewed    Pulmonary  Within defined limits                 Neuro/Psych         Psychiatric history     Cardiovascular  Within defined limits                Exercise tolerance: >4 METS     GI/Hepatic/Renal     GERD: well controlled           Endo/Other  Within defined limits           Other Findings              Physical Exam    Airway  Mallampati: II  TM Distance: 4 - 6 cm  Neck ROM: normal range of motion   Mouth opening: Normal     Cardiovascular  Regular rate and rhythm,  S1 and S2 normal,  no murmur, click, rub, or gallop             Dental  No notable dental hx       Pulmonary  Breath sounds clear to auscultation               Abdominal  GI exam deferred       Other Findings            Anesthetic Plan    ASA: 2  Anesthesia type: MAC          Induction: Intravenous  Anesthetic plan and risks discussed with: Patient

## 2018-06-23 NOTE — Op Note (Signed)
DATE:06/23/2018  SURGEON: Truett PernaSamuel A. Marchel Foote, MD   PREOPERATIVE DIAGNOSIS: Cataract, right eye.   POSTOPERATIVE DIAGNOSIS: Cataract, right eye.   PROCEDURE PERFORMED: Phacoemulsification cataract   extraction with posterior chamber intraocular lens in the right eye.   ANESTHESIOLOGIST: Dr. Cherre HugerMack  ASSISTANT: None.   ANESTHESIA: Topical.   DRAINS: None.   SPECIMEN: None.   IMPLANT: [id number]  ESTIMATED BLOOD LOSS: None.   COMPLICATIONS: None.   DISPOSITION: Stable to recovery.   DESCRIPTION OF PROCEDURE:   After discussing the risks, benefits, and alternatives with the patient, appropriate informed operative consent was obtained. The patient was brought to the   operating room, placed in the supine position on the operating bed and appropriate monitoring was established by anesthesia.   Ocular anesthesia was achieved with 2% lidocaine jelly in the operative eye.   The patient was then prepped and draped in the usual sterile fashion for major   intraocular surgery. Lid speculum was placed in the operative eye.   Paracentesis incisions were made at approximately the 5'o'clock and 11'o'clock positions and was followed by injection of preservative-free Shugarcaine into the anterior   chamber. Next, the anterior chamber was filled with viscoelastic. A   temporal clear corneal incision was made with a 2.75-mm   keratome blade. A cystotome needle was used to initiate a   capsulorrhexis tear. Utrata forceps were used to complete a   continuous curvilinear capsulorrhexis. BSS on a cannula was used   to perform hydrodissection and hydrodelineation of the lens. Lens   was confirmed to be freely mobile. Phacoemulsification unit, after   being properly tuned and tested, was used to emulsify and remove   the nucleus without complication. Residual cortical material was   removed using bimanual irrigation-aspiration. Next, the capsular   bag was inflated with viscoelastic. The appropriately powered    intraocular lens was folded and injected through the clear corneal   wound into the capsular bag. Lens was positioned using a Kuglen   hook. Correct orientation and centration was confirmed by direct   visualization. Next, residual viscoelastic was removed using   irrigation and aspiration. BSS on a canula was used to hydrate the wounds as needed. Wounds were tested for leaks and none were found. Anterior chamber was deep and formed. Lens was well positioned within the capsular bag. Speculum was removed   along with the drapes. TobraDex eyedrops were placed in the   operative eye and shield was placed over the operative eye. The   patient tolerated the procedure well and was brought to recovery room   in stable condition. There were no complications.

## 2018-06-23 NOTE — Interval H&P Note (Signed)
H&P Update:  Alison Spencer was seen and examined.  History and physical has been reviewed. The patient has been examined. There have been no significant clinical changes since the completion of the originally dated History and Physical.

## 2018-06-30 NOTE — Anesthesia Post-Procedure Evaluation (Signed)
Procedure(s):  CATARACT EXTRACTION WITH INTRA OCULAR LENS IMPLANT   RIGHT.    MAC    Anesthesia Post Evaluation      Multimodal analgesia: multimodal analgesia not used between 6 hours prior to anesthesia start to PACU discharge  Patient location during evaluation: bedside  Patient participation: complete - patient participated  Level of consciousness: awake  Pain score: 0  Pain management: adequate  Airway patency: patent  Anesthetic complications: no  Cardiovascular status: acceptable  Respiratory status: acceptable  Hydration status: acceptable  Post anesthesia nausea and vomiting:  none      No vitals data found for the desired time range.

## 2018-06-30 NOTE — Anesthesia Post-Procedure Evaluation (Signed)
Procedure(s):  CATARACT EXTRACTION WITH INTRA OCULAR LENS IMPLANT   RIGHT.    MAC    Anesthesia Post Evaluation      Multimodal analgesia: multimodal analgesia not used between 6 hours prior to anesthesia start to PACU discharge  Patient location during evaluation: bedside  Patient participation: complete - patient participated  Level of consciousness: awake  Pain score: 0  Pain management: adequate  Airway patency: patent  Anesthetic complications: no  Cardiovascular status: acceptable  Respiratory status: acceptable  Hydration status: acceptable  Post anesthesia nausea and vomiting:  none      No vitals data found for the desired time range.

## 2018-07-01 MED FILL — LACTATED RINGERS IV: INTRAVENOUS | Qty: 1000

## 2019-07-19 ENCOUNTER — Encounter: Payer: Self-pay | Admitting: Gastroenterology

## 2021-10-09 ENCOUNTER — Encounter: Payer: Self-pay | Admitting: Gastroenterology
